# Patient Record
Sex: Female | Born: 1970
Health system: Southern US, Community
[De-identification: ages and names within clinical notes are randomized; demographics above are authoritative.]

## PROBLEM LIST (undated history)

## (undated) DIAGNOSIS — E785 Hyperlipidemia, unspecified: Secondary | ICD-10-CM

## (undated) DIAGNOSIS — D1800 Hemangioma unspecified site: Secondary | ICD-10-CM

## (undated) HISTORY — PX: TONSILLECTOMY: SUR1361

## (undated) HISTORY — PX: TUBAL LIGATION: SHX77

## (undated) HISTORY — PX: ABDOMINAL HYSTERECTOMY: SHX81

## (undated) HISTORY — DX: Hemangioma unspecified site: D18.00

## (undated) HISTORY — DX: Hyperlipidemia, unspecified: E78.5

---

## 2000-11-04 ENCOUNTER — Ambulatory Visit (HOSPITAL_COMMUNITY): Admission: RE | Admit: 2000-11-04 | Discharge: 2000-11-04 | Payer: Self-pay | Admitting: *Deleted

## 2000-11-04 ENCOUNTER — Encounter: Payer: Self-pay | Admitting: *Deleted

## 2001-03-19 ENCOUNTER — Encounter: Payer: Self-pay | Admitting: Internal Medicine

## 2001-03-19 ENCOUNTER — Ambulatory Visit (HOSPITAL_COMMUNITY): Admission: RE | Admit: 2001-03-19 | Discharge: 2001-03-19 | Payer: Self-pay | Admitting: Internal Medicine

## 2001-04-13 ENCOUNTER — Encounter: Payer: Self-pay | Admitting: Gastroenterology

## 2001-04-13 ENCOUNTER — Ambulatory Visit (HOSPITAL_COMMUNITY): Admission: RE | Admit: 2001-04-13 | Discharge: 2001-04-13 | Payer: Self-pay | Admitting: Gastroenterology

## 2002-01-15 ENCOUNTER — Emergency Department (HOSPITAL_COMMUNITY): Admission: EM | Admit: 2002-01-15 | Discharge: 2002-01-15 | Payer: Self-pay | Admitting: Emergency Medicine

## 2002-01-15 ENCOUNTER — Encounter: Payer: Self-pay | Admitting: Emergency Medicine

## 2003-06-27 ENCOUNTER — Ambulatory Visit (HOSPITAL_COMMUNITY): Admission: RE | Admit: 2003-06-27 | Discharge: 2003-06-27 | Payer: Self-pay | Admitting: Otolaryngology

## 2003-08-21 ENCOUNTER — Emergency Department (HOSPITAL_COMMUNITY): Admission: EM | Admit: 2003-08-21 | Discharge: 2003-08-21 | Payer: Self-pay | Admitting: Emergency Medicine

## 2005-08-27 ENCOUNTER — Ambulatory Visit (HOSPITAL_COMMUNITY): Admission: RE | Admit: 2005-08-27 | Discharge: 2005-08-27 | Payer: Self-pay | Admitting: Family Medicine

## 2007-08-08 ENCOUNTER — Encounter (HOSPITAL_COMMUNITY): Admission: RE | Admit: 2007-08-08 | Discharge: 2007-09-07 | Payer: Self-pay | Admitting: Internal Medicine

## 2007-08-08 ENCOUNTER — Ambulatory Visit (HOSPITAL_COMMUNITY): Payer: Self-pay | Admitting: Internal Medicine

## 2007-08-17 ENCOUNTER — Ambulatory Visit (HOSPITAL_COMMUNITY): Admission: RE | Admit: 2007-08-17 | Discharge: 2007-08-17 | Payer: Self-pay | Admitting: Internal Medicine

## 2007-11-03 ENCOUNTER — Ambulatory Visit (HOSPITAL_COMMUNITY): Admission: RE | Admit: 2007-11-03 | Discharge: 2007-11-03 | Payer: Self-pay | Admitting: Family Medicine

## 2007-11-16 ENCOUNTER — Encounter (HOSPITAL_COMMUNITY): Admission: RE | Admit: 2007-11-16 | Discharge: 2007-12-16 | Payer: Self-pay | Admitting: Family Medicine

## 2007-12-22 ENCOUNTER — Ambulatory Visit (HOSPITAL_COMMUNITY): Admission: RE | Admit: 2007-12-22 | Discharge: 2007-12-22 | Payer: Self-pay | Admitting: Family Medicine

## 2009-02-14 ENCOUNTER — Emergency Department (HOSPITAL_COMMUNITY): Admission: EM | Admit: 2009-02-14 | Discharge: 2009-02-14 | Payer: Self-pay | Admitting: Emergency Medicine

## 2010-04-17 IMAGING — NM NM HEPATO W/GB/PHARM/[PERSON_NAME]
2 series · 12 of 12 positions shown · non-contrast
Comparison: none

CLINICAL DATA: Right upper abdominal pain.  Unremarkable
ultrasound.

[Series 1: hepatobiliary · 3.20mm/px · 6 of 60 frames shown (1 of 2)]
[frame 6/60]
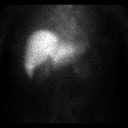
[frame 16/60]
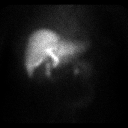
[frame 26/60]
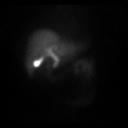
[frame 36/60]
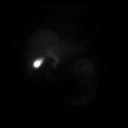
[frame 46/60]
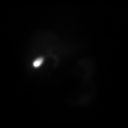
[frame 56/60]
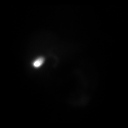

[Series 1: hepatobiliary · 3.20mm/px · 6 of 60 frames shown (2 of 2)]
[frame 6/60]
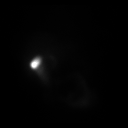
[frame 16/60]
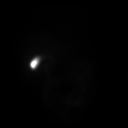
[frame 26/60]
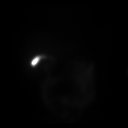
[frame 36/60]
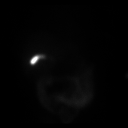
[frame 46/60]
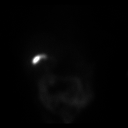
[frame 56/60]
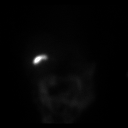

[12 of 12 positions shown; findings below may reference images not displayed]

HEPATOBILIARY SCINTIGRAPHY WITH EJECTION FRACTION

Anterior imaging after fivemCi RcWW9 Choletec IV. There is prompt
clearance of the radiopharmaceutical from the blood pool. Timely
visualization of activity in central bile ducts, small bowel, and
gallbladder.
After 1 hour, patient ingested 8 oz half-and-half p.o. The
calculated gallbladder ejection fraction over 60 minutes is 61%.

IMPRESSION
1. Patency of cystic and common bile ducts.
2. Normal gallbladder ejection fraction.

## 2010-10-15 ENCOUNTER — Other Ambulatory Visit (HOSPITAL_COMMUNITY): Payer: Self-pay | Admitting: Internal Medicine

## 2010-10-15 DIAGNOSIS — R142 Eructation: Secondary | ICD-10-CM

## 2010-10-15 DIAGNOSIS — R141 Gas pain: Secondary | ICD-10-CM

## 2010-10-19 ENCOUNTER — Ambulatory Visit (HOSPITAL_COMMUNITY)
Admission: RE | Admit: 2010-10-19 | Discharge: 2010-10-19 | Disposition: A | Discharge status: Home / Self Care | Payer: BC Managed Care – PPO | Source: Ambulatory Visit | Attending: Internal Medicine | Admitting: Internal Medicine

## 2010-10-19 DIAGNOSIS — R109 Unspecified abdominal pain: Secondary | ICD-10-CM | POA: Insufficient documentation

## 2010-10-19 DIAGNOSIS — R142 Eructation: Secondary | ICD-10-CM

## 2010-10-19 DIAGNOSIS — R141 Gas pain: Secondary | ICD-10-CM

## 2010-10-19 DIAGNOSIS — R932 Abnormal findings on diagnostic imaging of liver and biliary tract: Secondary | ICD-10-CM | POA: Insufficient documentation

## 2010-10-22 ENCOUNTER — Other Ambulatory Visit (HOSPITAL_COMMUNITY): Payer: Self-pay | Admitting: Internal Medicine

## 2010-10-22 DIAGNOSIS — R10811 Right upper quadrant abdominal tenderness: Secondary | ICD-10-CM

## 2010-10-23 ENCOUNTER — Encounter (HOSPITAL_COMMUNITY): Payer: Self-pay

## 2010-10-23 ENCOUNTER — Encounter (HOSPITAL_COMMUNITY)
Admission: RE | Admit: 2010-10-23 | Discharge: 2010-10-23 | Disposition: A | Discharge status: Home / Self Care | Payer: BC Managed Care – PPO | Source: Ambulatory Visit | Attending: Internal Medicine | Admitting: Internal Medicine

## 2010-10-23 DIAGNOSIS — R1011 Right upper quadrant pain: Secondary | ICD-10-CM | POA: Insufficient documentation

## 2010-10-23 DIAGNOSIS — R10811 Right upper quadrant abdominal tenderness: Secondary | ICD-10-CM

## 2010-10-23 MED ORDER — TECHNETIUM TC 99M MEBROFENIN IV KIT
5.0000 | PACK | Freq: Once | INTRAVENOUS | Status: AC | PRN
  Administered 2010-10-23: 5.1 via INTRAVENOUS

## 2010-10-23 NOTE — Op Note (Signed)
NAME:  Briana Clark, Briana Clark                          ACCOUNT NO.:  [id number]   MEDICAL RECORD NO.:  [id number]                   PATIENT TYPE:  EMS   LOCATION:  ED                                   FACILITY:  [facility]   PHYSICIAN:  Gerrit Friends. Rourk, M.D.               DATE OF BIRTH:  27-Nov-1970   DATE OF PROCEDURE:  01/15/2002  DATE OF DISCHARGE:  01/15/2002                                 OPERATIVE REPORT   PROCEDURE:  Emergent esophagogastroduodenoscopy with removal of esophageal  food impaction.   ENDOSCOPIST:  Gerrit Friends. Rourk, M.D.   INDICATIONS FOR PROCEDURE:  The patient is a 40 year old lady with chronic  esophageal dysphagia and gastroesophageal reflux disease for which she takes  Nexium.  She underwent esophageal by Dr. Ritta Slot down in [place] last  November.  She was having esophageal dysphagia.  She is not sure exactly  what was found; however, she has had recurrent esophageal dysphagia.  She  was eating chicken for lunch today and felt it get hung.  She has not been  able to swallow anything, including her saliva, ever since.  Glucagon and  Versed were not helpful in the emergency department, prescribed by Dr. Rosalia Hammers,  unfortunately.  Emergent EGD is now being done to remove probable food  impaction.  This approach has been discussed with the patient at the  bedside.  The potential risks, benefits, and alternatives have been  reviewed; and questions answered.  She is agreeable.  Please see my  handwritten H&P.  She is at low risk for conscious sedation.   DESCRIPTION OF PROCEDURE:  O2 saturation, blood pressure, pulse and  respirations were monitored throughout the entire procedure.   CONSCIOUS SEDATION:  Versed 4 mg IV, Demerol 100 mg IV in divided doses,  Cetacaine was gargled and expectorated.   INSTRUMENT:  Olympus video chip gastroscope.   FINDINGS:  Examination of the tubular esophagus revealed a  large bolus of  food involving the distal esophagus.  The tip of  the scope was approximated  against the bolus and it was pushed into the esophagus across a muscular  appearing Schatzki ring.  There appeared to be a good-sized hiatal hernia.   STOMACH:  There was quite a bit of food in the stomach which precluded  complete examination.   The patient tolerated the procedure well and was reacted in endoscopy.   IMPRESSION:  1. Esophageal food impaction. Status post disimpaction as described above.  2. Muscular appearing Schatzki ring, moderate-to-large size hiatal hernia.     Exam incomplete for reasons outlined above.   RECOMMENDATIONS:  1. Chew food thoroughly have liquids on hand to assist with the swallowing     process.  2. Continue Nexium.  3. Will make arrangements to bring the patient back for an elective EGD with     esophageal dilation in the very near future.   I  would like to thank Dr. Margarita Grizzle for allowing me to see this nice  lady.                                               Gerrit Friends. Rourk, M.D.    RMR/MEDQ  D:  01/15/2002  T:  01/22/2002  Job:  [id number]   cc:   [facility]   Hilario Quarry, M.D.   Emergency Department

## 2014-06-06 ENCOUNTER — Other Ambulatory Visit (HOSPITAL_COMMUNITY): Payer: Self-pay | Admitting: Family Medicine

## 2014-06-06 DIAGNOSIS — R1011 Right upper quadrant pain: Secondary | ICD-10-CM

## 2014-06-13 ENCOUNTER — Encounter (HOSPITAL_COMMUNITY)
Admission: RE | Admit: 2014-06-13 | Discharge: 2014-06-13 | Disposition: A | Discharge status: Home / Self Care | Payer: BLUE CROSS/BLUE SHIELD | Source: Ambulatory Visit | Attending: Family Medicine | Admitting: Family Medicine

## 2014-06-13 ENCOUNTER — Encounter (HOSPITAL_COMMUNITY): Payer: Self-pay

## 2014-06-13 DIAGNOSIS — R1011 Right upper quadrant pain: Secondary | ICD-10-CM | POA: Insufficient documentation

## 2014-06-13 MED ORDER — TECHNETIUM TC 99M MEBROFENIN IV KIT
5.0000 | PACK | Freq: Once | INTRAVENOUS | Status: AC | PRN
Start: 2014-06-13 — End: 2014-06-13
  Administered 2014-06-13: 5 via INTRAVENOUS

## 2014-06-13 MED ORDER — SINCALIDE 5 MCG IJ SOLR
INTRAMUSCULAR | Status: AC
Start: 2014-06-13 — End: 2014-06-13
  Administered 2014-06-13: 1.68 ug via INTRAVENOUS
  Filled 2014-06-13: qty 5

## 2014-06-13 MED ORDER — STERILE WATER FOR INJECTION IJ SOLN
INTRAMUSCULAR | Status: AC
  Administered 2014-06-13: 5 mL via INTRAVENOUS
  Filled 2014-06-13: qty 10

## 2014-06-13 MED ORDER — SODIUM CHLORIDE 0.9 % IJ SOLN
INTRAMUSCULAR | Status: AC
Start: 2014-06-13 — End: 2014-06-13
  Filled 2014-06-13: qty 36

## 2014-10-01 ENCOUNTER — Encounter (INDEPENDENT_AMBULATORY_CARE_PROVIDER_SITE_OTHER): Payer: Self-pay | Admitting: *Deleted

## 2014-10-15 ENCOUNTER — Ambulatory Visit (INDEPENDENT_AMBULATORY_CARE_PROVIDER_SITE_OTHER): Payer: BLUE CROSS/BLUE SHIELD | Admitting: Internal Medicine

## 2015-01-14 ENCOUNTER — Encounter: Payer: Self-pay | Admitting: Internal Medicine

## 2015-02-06 ENCOUNTER — Ambulatory Visit: Payer: BLUE CROSS/BLUE SHIELD | Admitting: Gastroenterology

## 2015-11-23 DIAGNOSIS — M62830 Muscle spasm of back: Secondary | ICD-10-CM | POA: Diagnosis not present

## 2015-11-23 DIAGNOSIS — G5603 Carpal tunnel syndrome, bilateral upper limbs: Secondary | ICD-10-CM | POA: Diagnosis not present

## 2016-02-17 ENCOUNTER — Encounter: Payer: Self-pay | Admitting: Orthopaedic Surgery

## 2016-02-17 ENCOUNTER — Ambulatory Visit (INDEPENDENT_AMBULATORY_CARE_PROVIDER_SITE_OTHER): Payer: BLUE CROSS/BLUE SHIELD | Admitting: Orthopaedic Surgery

## 2016-02-17 VITALS — BP 118/78 | HR 111 | Temp 97.5°F | Ht 63.5 in | Wt 185.0 lb

## 2016-02-17 DIAGNOSIS — G5603 Carpal tunnel syndrome, bilateral upper limbs: Secondary | ICD-10-CM | POA: Diagnosis not present

## 2016-02-17 DIAGNOSIS — J42 Unspecified chronic bronchitis: Secondary | ICD-10-CM

## 2016-02-17 DIAGNOSIS — F17200 Nicotine dependence, unspecified, uncomplicated: Secondary | ICD-10-CM

## 2016-02-17 DIAGNOSIS — Z720 Tobacco use: Secondary | ICD-10-CM

## 2016-02-17 NOTE — Progress Notes (Signed)
Subjective:    Patient ID: Briana Clark, female    DOB: 11-Mar-1971, 45 y.o.   MRN: [id number]  HPI The patient has a many month history of pain and tenderness of both hands with nocturnal numbness, pain after driving the car a long distance with numbness in both hands, dropping things. She has tried Advil, ice, heat and night splints.  The splints helped at first but they do nothing now.  She has pain running up her arm at times to the shoulders.  She is not getting any better.  Her family doctor told her she had carpal tunnel syndrome and needed surgery now.  She has no trauma.  She is a tobacco smoker.  She is willing to quit  She has COPD.  Review of Systems  HENT: Negative for congestion.   Respiratory: Positive for shortness of breath. Negative for cough.   Cardiovascular: Negative for chest pain and leg swelling.  Endocrine: Positive for cold intolerance.  Musculoskeletal: Positive for arthralgias and myalgias.  Allergic/Immunologic: Positive for environmental allergies.   No past medical history on file.  No past surgical history on file.  No current outpatient prescriptions on file prior to visit.   No current facility-administered medications on file prior to visit.     Social History   Social History  . Marital status: Married    Spouse name: N/A  . Number of children: N/A  . Years of education: N/A   Occupational History  . Not on file.   Social History Main Topics  . Smoking status: Current Every Day Smoker  . Smokeless tobacco: Never Used  . Alcohol use Not on file  . Drug use: Unknown  . Sexual activity: Not on file   Other Topics Concern  . Not on file   Social History Narrative  . No narrative on file    Family History  Problem Relation Age of Onset  . Asthma Mother   . COPD Mother   . Cancer Father     BP 118/78   Pulse (!) 111   Temp 97.5 F (36.4 C)   Ht 5' 3.5" (1.613 m)   Wt 185 lb (83.9 kg)   BMI 32.26 kg/m       Objective:   Physical Exam  Constitutional: She is oriented to person, place, and time. She appears well-developed and well-nourished.  HENT:  Head: Normocephalic and atraumatic.  Eyes: Conjunctivae and EOM are normal. Pupils are equal, round, and reactive to light.  Neck: Normal range of motion. Neck supple.  Cardiovascular: Normal rate, regular rhythm and intact distal pulses.   Pulmonary/Chest: Effort normal.  Abdominal: Soft.  Musculoskeletal: She exhibits tenderness (Pain both hands, positive Tinel and Phalan signs both hands, grips good, decreased sensation in median nerve distribution bilaterally.).  Neurological: She is alert and oriented to person, place, and time. She displays normal reflexes. No cranial nerve deficit. She exhibits normal muscle tone. Coordination normal.  Skin: Skin is warm and dry.  She has congenital hemangioma right dorsal hand.   Psychiatric: She has a normal mood and affect. Her behavior is normal. Judgment and thought content normal.          Assessment & Plan:   Encounter Diagnoses  Name Primary?  . Bilateral carpal tunnel syndrome Yes  . Tobacco smoker within last 12 months   . Chronic bronchitis, unspecified chronic bronchitis type (HCC)     I will have her get EMGs of the hands.  Return after  EMG done  Call if any problem  I have discussed possible surgery need with her.  She asked appropriate questions.  Precautions discussed.  Electronically Signed Darreld Mclean, MD 9/12/201711:10 AM

## 2016-02-17 NOTE — Patient Instructions (Signed)
Smoking Cessation, Tips for Success If you are ready to quit smoking, congratulations! You have chosen to help yourself be healthier. Cigarettes bring nicotine, tar, carbon monoxide, and other irritants into your body. Your lungs, heart, and blood vessels will be able to work better without these poisons. There are many different ways to quit smoking. Nicotine gum, nicotine patches, a nicotine inhaler, or nicotine nasal spray can help with physical craving. Hypnosis, support groups, and medicines help break the habit of smoking. WHAT THINGS CAN I DO TO MAKE QUITTING EASIER?  Here are some tips to help you quit for good:  Pick a date when you will quit smoking completely. Tell all of your friends and family about your plan to quit on that date.  Do not try to slowly cut down on the number of cigarettes you are smoking. Pick a quit date and quit smoking completely starting on that day.  Throw away all cigarettes.   Clean and remove all ashtrays from your home, work, and car.  On a card, write down your reasons for quitting. Carry the card with you and read it when you get the urge to smoke.  Cleanse your body of nicotine. Drink enough water and fluids to keep your urine clear or pale yellow. Do this after quitting to flush the nicotine from your body.  Learn to predict your moods. Do not let a bad situation be your excuse to have a cigarette. Some situations in your life might tempt you into wanting a cigarette.  Never have "just one" cigarette. It leads to wanting another and another. Remind yourself of your decision to quit.  Change habits associated with smoking. If you smoked while driving or when feeling stressed, try other activities to replace smoking. Stand up when drinking your coffee. Brush your teeth after eating. Sit in a different chair when you read the paper. Avoid alcohol while trying to quit, and try to drink fewer caffeinated beverages. Alcohol and caffeine may urge you to  smoke.  Avoid foods and drinks that can trigger a desire to smoke, such as sugary or spicy foods and alcohol.  Ask people who smoke not to smoke around you.  Have something planned to do right after eating or having a cup of coffee. For example, plan to take a walk or exercise.  Try a relaxation exercise to calm you down and decrease your stress. Remember, you may be tense and nervous for the first 2 weeks after you quit, but this will pass.  Find new activities to keep your hands busy. Play with a pen, coin, or rubber band. Doodle or draw things on paper.  Brush your teeth right after eating. This will help cut down on the craving for the taste of tobacco after meals. You can also try mouthwash.   Use oral substitutes in place of cigarettes. Try using lemon drops, carrots, cinnamon sticks, or chewing gum. Keep them handy so they are available when you have the urge to smoke.  When you have the urge to smoke, try deep breathing.  Designate your home as a nonsmoking area.  If you are a heavy smoker, ask your health care provider about a prescription for nicotine chewing gum. It can ease your withdrawal from nicotine.  Reward yourself. Set aside the cigarette money you save and buy yourself something nice.  Look for support from others. Join a support group or smoking cessation program. Ask someone at home or at work to help you with your plan   to quit smoking.  Always ask yourself, "Do I need this cigarette or is this just a reflex?" Tell yourself, "Today, I choose not to smoke," or "I do not want to smoke." You are reminding yourself of your decision to quit.  Do not replace cigarette smoking with electronic cigarettes (commonly called e-cigarettes). The safety of e-cigarettes is unknown, and some may contain harmful chemicals.  If you relapse, do not give up! Plan ahead and think about what you will do the next time you get the urge to smoke. HOW WILL I FEEL WHEN I QUIT SMOKING? You  may have symptoms of withdrawal because your body is used to nicotine (the addictive substance in cigarettes). You may crave cigarettes, be irritable, feel very hungry, cough often, get headaches, or have difficulty concentrating. The withdrawal symptoms are only temporary. They are strongest when you first quit but will go away within 10-14 days. When withdrawal symptoms occur, stay in control. Think about your reasons for quitting. Remind yourself that these are signs that your body is healing and getting used to being without cigarettes. Remember that withdrawal symptoms are easier to treat than the major diseases that smoking can cause.  Even after the withdrawal is over, expect periodic urges to smoke. However, these cravings are generally short lived and will go away whether you smoke or not. Do not smoke! WHAT RESOURCES ARE AVAILABLE TO HELP ME QUIT SMOKING? Your health care provider can direct you to community resources or hospitals for support, which may include:  Group support.  Education.  Hypnosis.  Therapy.   This information is not intended to replace advice given to you by your health care provider. Make sure you discuss any questions you have with your health care provider.   Document Released: 02/20/2004 Document Revised: 06/14/2014 Document Reviewed: 11/09/2012 Elsevier Interactive Patient Education 2016 [employer]  

## 2016-02-18 ENCOUNTER — Telehealth: Payer: Self-pay | Admitting: Radiology

## 2016-02-18 NOTE — Telephone Encounter (Signed)
EMG/NCV study was scheduled at [facility] for 02/23/16 @ 10:00am.  I called and left a message for the patient regarding the apt and the need for her to call and schedule a return apt.

## 2016-02-19 ENCOUNTER — Telehealth: Payer: Self-pay | Admitting: Radiology

## 2016-02-19 NOTE — Telephone Encounter (Signed)
Briana Clark spoke with the patient and gave her the information regarding the EMG apt and her return apt.  She stated she is going to call and reschedule the EMG for another day and will let us know.

## 2016-02-23 ENCOUNTER — Encounter: Payer: Self-pay | Admitting: Orthopaedic Surgery

## 2016-02-23 DIAGNOSIS — G5600 Carpal tunnel syndrome, unspecified upper limb: Secondary | ICD-10-CM | POA: Diagnosis not present

## 2016-02-26 ENCOUNTER — Encounter: Payer: Self-pay | Admitting: Orthopaedic Surgery

## 2016-02-26 ENCOUNTER — Ambulatory Visit (INDEPENDENT_AMBULATORY_CARE_PROVIDER_SITE_OTHER): Payer: BLUE CROSS/BLUE SHIELD | Admitting: Orthopaedic Surgery

## 2016-02-26 VITALS — BP 126/81 | HR 99 | Temp 97.3°F | Ht 63.0 in | Wt 184.0 lb

## 2016-02-26 DIAGNOSIS — M25532 Pain in left wrist: Secondary | ICD-10-CM

## 2016-02-26 DIAGNOSIS — M25531 Pain in right wrist: Secondary | ICD-10-CM | POA: Diagnosis not present

## 2016-02-26 NOTE — Progress Notes (Signed)
Patient Briana Clark:[id number] B Zagorski, female DOB:May 31, 1971, 45 y.o. [id number]  Chief Complaint  Patient presents with  . Follow-up    bilateral wrist pain, emg review    HPI  Robbie B Handal is a 45 y.o. female who has had pain in both wrists consistent with carpal tunnel syndrome.  She has night nocturnal pain.  She had EMGs done which showed a normal study, no evidence of a median mononeuropathy present.  Study done on 02-23-16.  I have informed her of the findings.  I have recommended Aleve, samples given. HPI  Body mass index is 32.59 kg/m.  ROS  Review of Systems  HENT: Negative for congestion.   Respiratory: Positive for shortness of breath. Negative for cough.   Cardiovascular: Negative for chest pain and leg swelling.  Endocrine: Positive for cold intolerance.  Musculoskeletal: Positive for arthralgias and myalgias.  Allergic/Immunologic: Positive for environmental allergies.    No past medical history on file.  No past surgical history on file.  Family History  Problem Relation Age of Onset  . Asthma Mother   . COPD Mother   . Cancer Father     Social History Social History  Substance Use Topics  . Smoking status: Current Every Day Smoker  . Smokeless tobacco: Never Used  . Alcohol use Not on file    Allergies  Allergen Reactions  . Latex   . Penicillins     Current Outpatient Prescriptions  Medication Sig Dispense Refill  . Biotin w/ Vitamins C & E (HAIR/SKIN/NAILS PO) Take by mouth.    . OMEPRAZOLE PO Take by mouth.     No current facility-administered medications for this visit.      Physical Exam  Blood pressure 126/81, pulse 99, temperature 97.3 F (36.3 C), height 5\' 3"  (1.6 m), weight 184 lb (83.5 kg).  Constitutional: overall normal hygiene, normal nutrition, well developed, normal grooming, normal body habitus. Assistive device:none  Musculoskeletal: gait and station Limp none, muscle tone and strength are normal, no tremors or atrophy  is present.  .  Neurological: coordination overall normal.  Deep tendon reflex/nerve stretch intact.  Sensation normal.  Cranial nerves II-XII intact.   Skin:   Normal overall no scars, lesions, ulcers or rashes. No psoriasis.  Psychiatric: Alert and oriented x 3.  Recent memory intact, remote memory unclear.  Normal mood and affect. Well groomed.  Good eye contact.  Cardiovascular: overall no swelling, no varicosities, no edema bilaterally, normal temperatures of the legs and arms, no clubbing, cyanosis and good capillary refill.  Lymphatic: palpation is normal.  She has positive Tinel and Phalens sign on both wrists. ROM is full.  NV intact.  The patient has been educated about the nature of the problem(s) and counseled on treatment options.  The patient appeared to understand what I have discussed and is in agreement with it.  Encounter Diagnosis  Name Primary?  . Pain in both wrists Yes    PLAN Call if any problems.  Precautions discussed.  Continue current medications.   Return to clinic PRN   Electronically Signed Sanjuana Kava, MD 9/21/201710:04 AM

## 2016-02-26 NOTE — Patient Instructions (Signed)
Smoking Cessation, Tips for Success If you are ready to quit smoking, congratulations! You have chosen to help yourself be healthier. Cigarettes bring nicotine, tar, carbon monoxide, and other irritants into your body. Your lungs, heart, and blood vessels will be able to work better without these poisons. There are many different ways to quit smoking. Nicotine gum, nicotine patches, a nicotine inhaler, or nicotine nasal spray can help with physical craving. Hypnosis, support groups, and medicines help break the habit of smoking. WHAT THINGS CAN I DO TO MAKE QUITTING EASIER?  Here are some tips to help you quit for good:  Pick a date when you will quit smoking completely. Tell all of your friends and family about your plan to quit on that date.  Do not try to slowly cut down on the number of cigarettes you are smoking. Pick a quit date and quit smoking completely starting on that day.  Throw away all cigarettes.   Clean and remove all ashtrays from your home, work, and car.  On a card, write down your reasons for quitting. Carry the card with you and read it when you get the urge to smoke.  Cleanse your body of nicotine. Drink enough water and fluids to keep your urine clear or pale yellow. Do this after quitting to flush the nicotine from your body.  Learn to predict your moods. Do not let a bad situation be your excuse to have a cigarette. Some situations in your life might tempt you into wanting a cigarette.  Never have "just one" cigarette. It leads to wanting another and another. Remind yourself of your decision to quit.  Change habits associated with smoking. If you smoked while driving or when feeling stressed, try other activities to replace smoking. Stand up when drinking your coffee. Brush your teeth after eating. Sit in a different chair when you read the paper. Avoid alcohol while trying to quit, and try to drink fewer caffeinated beverages. Alcohol and caffeine may urge you to  smoke.  Avoid foods and drinks that can trigger a desire to smoke, such as sugary or spicy foods and alcohol.  Ask people who smoke not to smoke around you.  Have something planned to do right after eating or having a cup of coffee. For example, plan to take a walk or exercise.  Try a relaxation exercise to calm you down and decrease your stress. Remember, you may be tense and nervous for the first 2 weeks after you quit, but this will pass.  Find new activities to keep your hands busy. Play with a pen, coin, or rubber band. Doodle or draw things on paper.  Brush your teeth right after eating. This will help cut down on the craving for the taste of tobacco after meals. You can also try mouthwash.   Use oral substitutes in place of cigarettes. Try using lemon drops, carrots, cinnamon sticks, or chewing gum. Keep them handy so they are available when you have the urge to smoke.  When you have the urge to smoke, try deep breathing.  Designate your home as a nonsmoking area.  If you are a heavy smoker, ask your health care provider about a prescription for nicotine chewing gum. It can ease your withdrawal from nicotine.  Reward yourself. Set aside the cigarette money you save and buy yourself something nice.  Look for support from others. Join a support group or smoking cessation program. Ask someone at home or at work to help you with your plan   to quit smoking.  Always ask yourself, "Do I need this cigarette or is this just a reflex?" Tell yourself, "Today, I choose not to smoke," or "I do not want to smoke." You are reminding yourself of your decision to quit.  Do not replace cigarette smoking with electronic cigarettes (commonly called e-cigarettes). The safety of e-cigarettes is unknown, and some may contain harmful chemicals.  If you relapse, do not give up! Plan ahead and think about what you will do the next time you get the urge to smoke. HOW WILL I FEEL WHEN I QUIT SMOKING? You  may have symptoms of withdrawal because your body is used to nicotine (the addictive substance in cigarettes). You may crave cigarettes, be irritable, feel very hungry, cough often, get headaches, or have difficulty concentrating. The withdrawal symptoms are only temporary. They are strongest when you first quit but will go away within 10-14 days. When withdrawal symptoms occur, stay in control. Think about your reasons for quitting. Remind yourself that these are signs that your body is healing and getting used to being without cigarettes. Remember that withdrawal symptoms are easier to treat than the major diseases that smoking can cause.  Even after the withdrawal is over, expect periodic urges to smoke. However, these cravings are generally short lived and will go away whether you smoke or not. Do not smoke! WHAT RESOURCES ARE AVAILABLE TO HELP ME QUIT SMOKING? Your health care provider can direct you to community resources or hospitals for support, which may include:  Group support.  Education.  Hypnosis.  Therapy.   This information is not intended to replace advice given to you by your health care provider. Make sure you discuss any questions you have with your health care provider.   Document Released: 02/20/2004 Document Revised: 06/14/2014 Document Reviewed: 11/09/2012 Elsevier Interactive Patient Education 2016 [employer]  

## 2016-03-08 DIAGNOSIS — J069 Acute upper respiratory infection, unspecified: Secondary | ICD-10-CM | POA: Diagnosis not present

## 2016-03-08 DIAGNOSIS — J329 Chronic sinusitis, unspecified: Secondary | ICD-10-CM | POA: Diagnosis not present

## 2016-08-23 DIAGNOSIS — J40 Bronchitis, not specified as acute or chronic: Secondary | ICD-10-CM | POA: Diagnosis not present

## 2016-08-23 DIAGNOSIS — R0981 Nasal congestion: Secondary | ICD-10-CM | POA: Diagnosis not present

## 2016-09-06 DIAGNOSIS — Z01419 Encounter for gynecological examination (general) (routine) without abnormal findings: Secondary | ICD-10-CM | POA: Diagnosis not present

## 2016-09-06 DIAGNOSIS — Z13220 Encounter for screening for lipoid disorders: Secondary | ICD-10-CM | POA: Diagnosis not present

## 2016-09-06 DIAGNOSIS — Z1329 Encounter for screening for other suspected endocrine disorder: Secondary | ICD-10-CM | POA: Diagnosis not present

## 2016-09-06 DIAGNOSIS — Z6834 Body mass index (BMI) 34.0-34.9, adult: Secondary | ICD-10-CM | POA: Diagnosis not present

## 2016-09-06 DIAGNOSIS — Z131 Encounter for screening for diabetes mellitus: Secondary | ICD-10-CM | POA: Diagnosis not present

## 2016-09-06 DIAGNOSIS — Z1272 Encounter for screening for malignant neoplasm of vagina: Secondary | ICD-10-CM | POA: Diagnosis not present

## 2016-09-07 DIAGNOSIS — E7800 Pure hypercholesterolemia, unspecified: Secondary | ICD-10-CM | POA: Diagnosis not present

## 2016-09-07 DIAGNOSIS — Z6834 Body mass index (BMI) 34.0-34.9, adult: Secondary | ICD-10-CM | POA: Diagnosis not present

## 2016-11-25 DIAGNOSIS — R399 Unspecified symptoms and signs involving the genitourinary system: Secondary | ICD-10-CM | POA: Diagnosis not present

## 2016-11-25 DIAGNOSIS — N3000 Acute cystitis without hematuria: Secondary | ICD-10-CM | POA: Diagnosis not present

## 2016-12-15 DIAGNOSIS — Z6831 Body mass index (BMI) 31.0-31.9, adult: Secondary | ICD-10-CM | POA: Diagnosis not present

## 2016-12-15 DIAGNOSIS — E785 Hyperlipidemia, unspecified: Secondary | ICD-10-CM | POA: Diagnosis not present

## 2017-03-12 DIAGNOSIS — A499 Bacterial infection, unspecified: Secondary | ICD-10-CM | POA: Diagnosis not present

## 2017-03-12 DIAGNOSIS — N390 Urinary tract infection, site not specified: Secondary | ICD-10-CM | POA: Diagnosis not present

## 2017-03-12 DIAGNOSIS — R300 Dysuria: Secondary | ICD-10-CM | POA: Diagnosis not present

## 2017-06-13 DIAGNOSIS — H9201 Otalgia, right ear: Secondary | ICD-10-CM | POA: Diagnosis not present

## 2017-06-13 DIAGNOSIS — L659 Nonscarring hair loss, unspecified: Secondary | ICD-10-CM | POA: Diagnosis not present

## 2017-06-30 DIAGNOSIS — M5412 Radiculopathy, cervical region: Secondary | ICD-10-CM | POA: Diagnosis not present

## 2017-06-30 DIAGNOSIS — M542 Cervicalgia: Secondary | ICD-10-CM | POA: Diagnosis not present

## 2017-06-30 DIAGNOSIS — Z6831 Body mass index (BMI) 31.0-31.9, adult: Secondary | ICD-10-CM | POA: Diagnosis not present

## 2017-07-11 DIAGNOSIS — B029 Zoster without complications: Secondary | ICD-10-CM | POA: Diagnosis not present

## 2017-07-11 DIAGNOSIS — D225 Melanocytic nevi of trunk: Secondary | ICD-10-CM | POA: Diagnosis not present

## 2017-07-12 ENCOUNTER — Encounter: Payer: Self-pay | Admitting: Family Medicine

## 2017-07-12 ENCOUNTER — Other Ambulatory Visit: Payer: Self-pay

## 2017-07-12 ENCOUNTER — Ambulatory Visit: Payer: BLUE CROSS/BLUE SHIELD | Admitting: Family Medicine

## 2017-07-12 VITALS — BP 118/84 | HR 97 | Temp 97.9°F | Resp 16 | Ht 63.0 in | Wt 168.2 lb

## 2017-07-12 DIAGNOSIS — E785 Hyperlipidemia, unspecified: Secondary | ICD-10-CM | POA: Diagnosis not present

## 2017-07-12 DIAGNOSIS — F419 Anxiety disorder, unspecified: Secondary | ICD-10-CM

## 2017-07-12 DIAGNOSIS — B029 Zoster without complications: Secondary | ICD-10-CM

## 2017-07-12 DIAGNOSIS — Z720 Tobacco use: Secondary | ICD-10-CM

## 2017-07-12 MED ORDER — FLUOXETINE HCL 20 MG PO TABS: 20.0000 mg | ORAL_TABLET | Freq: Every day | ORAL | 2 refills | Status: DC

## 2017-07-12 NOTE — Progress Notes (Signed)
Patient ID: Shella Spearing, female    DOB: Jun 09, 1970, 47 y.o.   MRN: [id number]  Chief Complaint  Patient presents with  . Establish Care  . Fatigue    Allergies Latex and Penicillins  Subjective:   Arena B Kloos is a 47 y.o. female who presents to [facility] today.  HPI Here to establish care. Has been recently seen by dermatology and diagnosed with shingles on her right hand.  She reports that she is currently on Valtrex for shingles.  She reports that approximately 1 week prior to her shingles outbreak that she felt very fatigued.  She reports that she is taking the Valtrex medication but is still felt fatigued.  She does have a family history of Hashimoto's thyroiditis.  She recently had lab testing done by her gynecologist which was negative for Hashimoto's.  Her TSH was within normal limits.  She reports that she is starting to feel a little less fatigued but wanted to come in to be seen and establish care.  Her other concern is that she is a smoker.  She has smoked half a pack per day for the past 26 years.  She has tried to quit in the past and has used Chantix and Wellbutrin.  When she took the Chantix she had suicidal thoughts and so this medication was discontinued.  She was unable to tolerate the Wellbutrin due to constipation and the medication also provoked anxiety feelings.  She reports that she has suffered from anxiety for years.  She feels like the main reason she smokes is to help deal with her anxiety.  She reports that she worries constantly and her mind is always thinking and racing.  She does not feel depressed.  She denies any suicidal or homicidal ideations.  She was on citalopram many years ago for anxiety but had weight gain associated with the medication.  She was on this medication when her children were younger and she had more stress.  She ended up coming off the medication.  She believes that she would benefit from medication to help with anxiety.   She is married and had a good relationship with her husband.  Enjoys her job at [employer] here in [place].  Denies any alcohol or drug use.  Attends church regularly and enjoys reading.  Over the past 8 months has lost over 20 pounds by working on her diet and exercising.  Has been followed for her weight loss and high cholesterol by her gynecologist.  She reports that she has a follow-up with him soon and he will be rechecking her cholesterol.  She reports he also told her that she was on the border of being diabetic.  She reports that she is getting those labs done at his office and does not want those duplicated here.  She does report that she will have the labs sent to our office.  She denies any fevers, chills, nausea, vomiting, diarrhea.  Her weight loss is all been intentional.  She does not have menses on a monthly basis due to her hysterectomy.  Nicotine Dependence  Presents for initial visit. Symptoms include cravings and fatigue. Symptoms are negative for insomnia and sore throat. Preferred tobacco types include cigarettes. Preferred cigarette types include filtered. Preferred strength is light. Preferred cigarettes are non-menthol. Her urge triggers include company of smokers, meal time and stress. Her first smoke is before 6 AM. She smokes < 1/2 a pack of cigarettes per day. She started  smoking when she was 7-41 years old. Past treatments include varenicline and buproprion. The treatment provided no relief. Compliance with prior treatments has been good. Angelli is ready to quit. Nhyira has tried to quit 4 times. There is no history of alcohol abuse and drug use.  Anxiety  Presents for initial visit. Onset was 1 to 5 years ago. The problem has been waxing and waning. Symptoms include excessive worry, malaise, nervous/anxious behavior and restlessness. Patient reports no chest pain, compulsions, confusion, dizziness, dry mouth, feeling of choking, insomnia, muscle tension, nausea,  palpitations, panic, shortness of breath or suicidal ideas. Symptoms occur most days. The severity of symptoms is moderate and interfering with daily activities. Nothing aggravates the symptoms. The quality of sleep is good. Nighttime awakenings: occasional.   Her past medical history is significant for anxiety/panic attacks. There is no history of asthma, CHF, chronic lung disease, depression or suicide attempts. Past treatments include SSRIs. The treatment provided mild relief. Compliance with prior treatments has been good.    Past Medical History:  Diagnosis Date  . Hemangioma    right side/UE and LE  . Hyperlipidemia     Past Surgical History:  Procedure Laterality Date  . ABDOMINAL HYSTERECTOMY     TAH without BSO secondary to fibroids  . TONSILLECTOMY    . TUBAL LIGATION      Family History  Problem Relation Age of Onset  . Asthma Mother   . COPD Mother   . Hashimoto's thyroiditis Mother   . Brain cancer Father   . Thyroid cancer Sister   . Hypothyroidism Sister   . CAD Paternal Grandfather      Social History   Socioeconomic History  . Marital status: Married    Spouse name: None  . Number of children: None  . Years of education: None  . Highest education level: None  Social Needs  . Financial resource strain: None  . Food insecurity - worry: None  . Food insecurity - inability: None  . Transportation needs - medical: None  . Transportation needs - non-medical: None  Occupational History  . None  Tobacco Use  . Smoking status: Current Every Day Smoker    Packs/day: 0.50    Years: 26.00    Pack years: 13.00  . Smokeless tobacco: Never Used  . Tobacco comment: Chantix did not work-SI; welbutrin caused constipation  Substance and Sexual Activity  . Alcohol use: No    Frequency: Never  . Drug use: No  . Sexual activity: Yes    Birth control/protection: Surgical  Other Topics Concern  . None  Social History Narrative   Lives in [place], [place].    Works at [employer].   Married, 2 children.    Dogs, walks them regularly.    Eats all foods.   Wear seatbelt.   Enjoys reading and make wreaths.    Current Outpatient Medications on File Prior to Visit  Medication Sig Dispense Refill  . Biotin w/ Vitamins C & E (HAIR/SKIN/NAILS PO) Take by mouth.    . cyclobenzaprine (FLEXERIL) 10 MG tablet Take 10 mg by mouth.    . lidocaine (LIDODERM) 5 % Place onto the skin.    . valACYclovir (VALTREX) 1000 MG tablet      No current facility-administered medications on file prior to visit.     Review of Systems  Constitutional: Positive for fatigue. Negative for activity change, appetite change, fever and unexpected weight change.  HENT: Negative for sore throat, trouble swallowing  and voice change.   Eyes: Negative for visual disturbance.  Respiratory: Negative for cough, chest tightness and shortness of breath.   Cardiovascular: Negative for chest pain, palpitations and leg swelling.  Gastrointestinal: Negative for abdominal pain, nausea and vomiting.  Genitourinary: Negative for dysuria, frequency and urgency.  Musculoskeletal: Negative for myalgias and neck pain.  Skin: Positive for rash.       Has the rash from shingles.  Is always had right-sided hemangioma since birth.  Neurological: Negative for dizziness, syncope and light-headedness.  Hematological: Negative for adenopathy.  Psychiatric/Behavioral: Negative for behavioral problems, confusion, dysphoric mood, sleep disturbance and suicidal ideas. The patient is nervous/anxious. The patient does not have insomnia and is not hyperactive.      Objective:   BP 118/84 (BP Location: Left Arm, Patient Position: Sitting, Cuff Size: Normal)   Pulse 97   Temp 97.9 F (36.6 C) (Temporal)   Resp 16   Ht 5\' 3"  (1.6 m)   Wt 168 lb 4 oz (76.3 kg)   SpO2 97%   BMI 29.80 kg/m   Physical Exam  Constitutional: She is oriented to person, place, and time. She appears well-developed  and well-nourished. No distress.  HENT:  Head: Normocephalic and atraumatic.  Nose: Nose normal.  Mouth/Throat: No oropharyngeal exudate.  Eyes: Pupils are equal, round, and reactive to light. No scleral icterus.  Neck: Normal range of motion. Neck supple. No thyromegaly present.  Cardiovascular: Normal rate, regular rhythm and normal heart sounds.  Pulmonary/Chest: Effort normal and breath sounds normal. No respiratory distress.  Neurological: She is alert and oriented to person, place, and time. No cranial nerve deficit.  Skin: Skin is warm and dry.  Right-sided upper extremity large hemangioma with red/violaceous color.  Right palm with healing linear rash, dermatomal distribution.  No open vesicles or blisters.  Psychiatric: She has a normal mood and affect. Her behavior is normal. Judgment and thought content normal.  Nursing note and vitals reviewed.    Assessment and Plan  1. Anxiety Uncontrolled, chronic and persistent.  Lifestyle and relaxation techniques discussed with patient.  Continue exercise as directed.  Long discussion with patient today.  I do believe she would benefit from anxiety medications.  In addition, her tobacco use is associated with her anxiety.  Counseled regarding mechanism of action, possible side effect, risks versus benefits of medication.  She voiced understanding Suicide risks evaluated and documented in note if present or in the area below.  Patient does not have/denies the following risks: previous suicide attempts, family history of suicide, access to lethal means, prior history of psychiatric disorder, history of alcohol or substance abuse disorder, recent loss of a loved one, or severe hopelessness. Patient has protective factors of family and community support.  Patient reports that family believes is behaving rationally. Patient displays problem solving skills.   Patient specifically denies suicide ideation. Patient has access/information to  healthcare contacts if situation or mood changes where patient is a risk to self or others or mood becomes unstable.   During the encounter, the patient had good eye contact and firm handshake regarding safety contract and agreement to seek help if mood worsens and not to harm self.   Patient understands the treatment plan and is in agreement. Agrees to keep follow up and call prior or return to clinic if needed.   - FLUoxetine (PROZAC) 20 MG tablet; Take 1 tablet (20 mg total) by mouth daily.  Dispense: 30 tablet; Refill: 2  Patient counseled in detail  regarding the risks of medication. Told to call or return to clinic if develop any worrisome signs or symptoms. Patient voiced understanding.   2. Tobacco abuse The 5 A's Model for treating Tobacco Use and Dependence was used today. I have identified and documented tobacco use status for this patient. I have urged the patient to quit tobacco use. At this time, the patient is willing and ready to attempt to quit. We have discussed medication options to aid in smoking cessation including nicotine replacement therapy (NRT), zyban, and chantix. We have also discussed behavioral modifications, lifestyle changes, and patient support options to aid in tobacco cessation success. Patient was congratulated on their desire to make this positive change for their health. Patient instructed to keep their follow up to ensure success.  Patient will call the office back with her quit date.  3. Herpes zoster without complication Continue Valtrex as directed.  Call with questions concerns, or worrisome symptoms.  4. Hyperlipidemia, unspecified hyperlipidemia type Patient will have her lipid profile which is going to be drawn by her gynecologist sent to our office for review.  Today we did discuss dietary modifications.  She was congratulated on her weight loss.  She will have her labs sent to our office for review and at that time will calculate her ASCVD  risk.  Records requested from gynecologist and previous PCP.  Patient will bring in her immunization records.  Influenza vaccination deferred today secondary to current shingles outbreak. Return in about 4 weeks (around 08/09/2017) for follow up. Aliene Beams, MD 07/12/2017

## 2017-07-12 NOTE — Patient Instructions (Signed)
Steps to Quit Smoking Smoking tobacco can be bad for your health. It can also affect almost every organ in your body. Smoking puts you and people around you at risk for many serious long-lasting (chronic) diseases. Quitting smoking is hard, but it is one of the best things that you can do for your health. It is never too late to quit. What are the benefits of quitting smoking? When you quit smoking, you lower your risk for getting serious diseases and conditions. They can include:  Lung cancer or lung disease.  Heart disease.  Stroke.  Heart attack.  Not being able to have children (infertility).  Weak bones (osteoporosis) and broken bones (fractures).  If you have coughing, wheezing, and shortness of breath, those symptoms may get better when you quit. You may also get sick less often. If you are pregnant, quitting smoking can help to lower your chances of having a baby of low birth weight. What can I do to help me quit smoking? Talk with your doctor about what can help you quit smoking. Some things you can do (strategies) include:  Quitting smoking totally, instead of slowly cutting back how much you smoke over a period of time.  Going to in-person counseling. You are more likely to quit if you go to many counseling sessions.  Using resources and support systems, such as: ? Online chats with a counselor. ? Phone quitlines. ? Printed self-help materials. ? Support groups or group counseling. ? Text messaging programs. ? Mobile phone apps or applications.  Taking medicines. Some of these medicines may have nicotine in them. If you are pregnant or breastfeeding, do not take any medicines to quit smoking unless your doctor says it is okay. Talk with your doctor about counseling or other things that can help you.  Talk with your doctor about using more than one strategy at the same time, such as taking medicines while you are also going to in-person counseling. This can help make  quitting easier. What things can I do to make it easier to quit? Quitting smoking might feel very hard at first, but there is a lot that you can do to make it easier. Take these steps:  Talk to your family and friends. Ask them to support and encourage you.  Call phone quitlines, reach out to support groups, or work with a counselor.  Ask people who smoke to not smoke around you.  Avoid places that make you want (trigger) to smoke, such as: ? Bars. ? Parties. ? Smoke-break areas at work.  Spend time with people who do not smoke.  Lower the stress in your life. Stress can make you want to smoke. Try these things to help your stress: ? Getting regular exercise. ? Deep-breathing exercises. ? Yoga. ? Meditating. ? Doing a body scan. To do this, close your eyes, focus on one area of your body at a time from head to toe, and notice which parts of your body are tense. Try to relax the muscles in those areas.  Download or buy apps on your mobile phone or tablet that can help you stick to your quit plan. There are many free apps, such as QuitGuide from the CDC (Centers for Disease Control and Prevention). You can find more support from smokefree.gov and other websites.  This information is not intended to replace advice given to you by your health care provider. Make sure you discuss any questions you have with your health care provider. Document Released: 03/20/2009 Document   Revised: 01/20/2016 Document Reviewed: 10/08/2014 Elsevier Interactive Patient Education  2018 Elsevier Inc.  

## 2017-08-11 ENCOUNTER — Ambulatory Visit: Payer: BLUE CROSS/BLUE SHIELD | Admitting: Family Medicine

## 2017-09-09 ENCOUNTER — Ambulatory Visit: Payer: BLUE CROSS/BLUE SHIELD | Admitting: Family Medicine

## 2017-09-15 ENCOUNTER — Other Ambulatory Visit: Payer: Self-pay

## 2017-09-15 ENCOUNTER — Ambulatory Visit: Payer: BLUE CROSS/BLUE SHIELD | Admitting: Family Medicine

## 2017-09-15 ENCOUNTER — Encounter: Payer: Self-pay | Admitting: Family Medicine

## 2017-09-15 VITALS — BP 122/84 | HR 81 | Temp 97.4°F | Resp 16 | Ht 63.0 in | Wt 167.2 lb

## 2017-09-15 DIAGNOSIS — Z23 Encounter for immunization: Secondary | ICD-10-CM | POA: Diagnosis not present

## 2017-09-15 DIAGNOSIS — F419 Anxiety disorder, unspecified: Secondary | ICD-10-CM | POA: Diagnosis not present

## 2017-09-15 DIAGNOSIS — Z720 Tobacco use: Secondary | ICD-10-CM

## 2017-09-15 MED ORDER — FLUOXETINE HCL 20 MG PO TABS: 20.0000 mg | ORAL_TABLET | Freq: Every day | ORAL | 1 refills | Status: DC

## 2017-09-15 NOTE — Progress Notes (Signed)
Patient ID: Briana Clark, female    DOB: 24-Sep-1970, 47 y.o.   MRN: [id number]  Chief Complaint  Patient presents with  . Follow-up    Allergies Latex and Penicillins  Subjective:   Briana Clark is a 47 y.o. female who presents to [facility] today.  HPI Briana Clark presents for follow-up visit today.  She reports that her anxiety is improved on the Prozac.  She reports that she feels less stressed, anxious, and irritable.  Denies any side effects on the medication.  Does not feel down, depressed, or hopeless.  Energy level is good.  Is very busy with work and her daughter soccer season.  Reports she is trying to eat healthy and get more exercise.  Reports she is sleeping well.  Denies any suicidal or homicidal ideations.  Patient reports she is still smoking cigarettes but she has cut down some on her smoking.  She believes that she is been able to cut down because her anxiety has been decreased.  Anxiety  Presents for follow-up visit. Patient reports no decreased concentration, depressed mood, excessive worry, insomnia, nausea, nervous/anxious behavior, obsessions, panic, restlessness or suicidal ideas. Symptoms occur rarely. The severity of symptoms is mild. The quality of sleep is good. Nighttime awakenings: occasional.   Compliance with medications is 76-100%.    Past Medical History:  Diagnosis Date  . Hemangioma    right side/UE and LE  . Hyperlipidemia     Past Surgical History:  Procedure Laterality Date  . ABDOMINAL HYSTERECTOMY     TAH without BSO secondary to fibroids  . TONSILLECTOMY    . TUBAL LIGATION      Family History  Problem Relation Age of Onset  . Asthma Mother   . COPD Mother   . Hashimoto's thyroiditis Mother   . Brain cancer Father   . Thyroid cancer Sister   . Hypothyroidism Sister   . CAD Paternal Grandfather      Social History   Socioeconomic History  . Marital status: Married    Spouse name: Not on file  . Number of  children: Not on file  . Years of education: Not on file  . Highest education level: Not on file  Occupational History  . Not on file  Social Needs  . Financial resource strain: Not on file  . Food insecurity:    Worry: Not on file    Inability: Not on file  . Transportation needs:    Medical: Not on file    Non-medical: Not on file  Tobacco Use  . Smoking status: Current Every Day Smoker    Packs/day: 0.50    Years: 26.00    Pack years: 13.00  . Smokeless tobacco: Never Used  . Tobacco comment: Chantix did not work-SI; welbutrin caused constipation  Substance and Sexual Activity  . Alcohol use: No    Frequency: Never  . Drug use: No  . Sexual activity: Yes    Birth control/protection: Surgical  Lifestyle  . Physical activity:    Days per week: Not on file    Minutes per session: Not on file  . Stress: Not on file  Relationships  . Social connections:    Talks on phone: Not on file    Gets together: Not on file    Attends religious service: Not on file    Active member of club or organization: Not on file    Attends meetings of clubs or organizations: Not on file  Relationship status: Not on file  Other Topics Concern  . Not on file  Social History Narrative   Lives in [place], [place].   Works at [employer].   Married, 2 children.    Dogs, walks them regularly.    Eats all foods.   Wear seatbelt.   Enjoys reading and make wreaths.    Current Outpatient Medications on File Prior to Visit  Medication Sig Dispense Refill  . Biotin w/ Vitamins C & E (HAIR/SKIN/NAILS PO) Take by mouth.    Marland Kitchen FLUoxetine (PROZAC) 20 MG tablet Take 1 tablet (20 mg total) by mouth daily. 30 tablet 2   No current facility-administered medications on file prior to visit.     Review of Systems  Constitutional: Negative for appetite change, fatigue and unexpected weight change.  Gastrointestinal: Negative for abdominal pain, nausea and vomiting.  Psychiatric/Behavioral:  Negative for agitation, decreased concentration, dysphoric mood, self-injury, sleep disturbance and suicidal ideas. The patient is not nervous/anxious and does not have insomnia.      Objective:   BP 122/84 (BP Location: Left Arm, Patient Position: Sitting, Cuff Size: Normal)   Pulse 81   Temp (!) 97.4 F (36.3 C) (Temporal)   Resp 16   Ht 5\' 3"  (1.6 m)   Wt 167 lb 4 oz (75.9 kg)   SpO2 99%   BMI 29.63 kg/m   Physical Exam  Constitutional: She appears well-developed and well-nourished.  Eyes: Pupils are equal, round, and reactive to light. EOM are normal.  Neck: Normal range of motion. Neck supple.  Cardiovascular: Normal rate, regular rhythm and normal heart sounds.  Pulmonary/Chest: Effort normal and breath sounds normal.  Psychiatric: She has a normal mood and affect. Her speech is normal and behavior is normal. Judgment and thought content normal. Cognition and memory are normal. She expresses no homicidal and no suicidal ideation. She expresses no suicidal plans and no homicidal plans.  Vitals reviewed.    Assessment and Plan  1. Immunization due Vaccinations given today. - Tdap vaccine greater than or equal to 7yo IM  2. Anxiety Improved on medication. Patient counseled in detail regarding the risks of medication. Told to call or return to clinic if develop any worrisome signs or symptoms. Patient voiced understanding.  Patient agrees to follow-up in 3 months.  At that time we will check her cholesterol and other lab work.  She is going to start her exercise and diet program that she has been successful with in the past.  She understands that if she has any problems with her mood or any abrupt changes within her mood to please call or return to clinic.  She denies any suicidal ideations at this time.  She was told to call with any questions or concerns.  She will return to clinic fasting at her next visit to check her cholesterol and other lab work. - FLUoxetine (PROZAC) 20  MG tablet; Take 1 tablet (20 mg total) by mouth daily.  Dispense: 90 tablet; Refill: 1  The patient is asked to make an attempt to improve diet and exercise patterns to aid in medical management of this problem.  The 5 A's Model for treating Tobacco Use and Dependence was used today. I have identified and documented tobacco use status for this patient. I have urged the patient to quit tobacco use. At this time, the patient is not ready to attempt to quit. I have provided patient with information regarding risks, cessation techniques, and interventions that might increase  future attempts to quit smoking. I will plan on again addressing tobacco dependence at the next visit.  No follow-ups on file. Mack Hook, LPN [id number]

## 2017-11-11 ENCOUNTER — Encounter: Payer: Self-pay | Admitting: Family Medicine

## 2017-12-16 ENCOUNTER — Ambulatory Visit: Payer: BLUE CROSS/BLUE SHIELD | Admitting: Family Medicine

## 2017-12-16 ENCOUNTER — Encounter: Payer: Self-pay | Admitting: Family Medicine

## 2017-12-16 ENCOUNTER — Other Ambulatory Visit: Payer: Self-pay

## 2017-12-16 VITALS — BP 110/70 | HR 99 | Temp 97.9°F | Resp 12 | Ht 62.0 in | Wt 169.0 lb

## 2017-12-16 DIAGNOSIS — E782 Mixed hyperlipidemia: Secondary | ICD-10-CM

## 2017-12-16 DIAGNOSIS — F419 Anxiety disorder, unspecified: Secondary | ICD-10-CM | POA: Diagnosis not present

## 2017-12-16 MED ORDER — FLUOXETINE HCL 40 MG PO CAPS: 40.0000 mg | ORAL_CAPSULE | Freq: Every day | ORAL | 0 refills | Status: DC

## 2017-12-16 NOTE — Progress Notes (Signed)
Patient ID: Briana Clark, female    DOB: [birth year], 47 y.o.   MRN: [id number]  Chief Complaint  Patient presents with  . Anxiety    follow up on medication    Allergies Latex and Penicillins  Subjective:   Briana Clark is a 47 y.o. female who presents to [facility] today.  HPI Here for follow up. Has been doing well. Reports that is doing well but has been feeling sad because her daughter is getting ready to leave for college as a Printmaker. Reports that this will be positive b/c will have time for self. Has been walking daily. Husband has been walking with her too. Reports that has gained a few pounds but has been eating a bit more b/c of graduation parties. Reports that the prozac is helping with mood but still feels "lost". Reports that feels a bit sad and down. Gets up and goes to work but has had lower energy and feels down about daughter leaving the house. Feels like could cry. Does not want to check cholesterol today. Would like to get labs prior to next OV. Energy is good. Walking most days of the week with husband. No SI/HI. Feels like the medication helps her not to worry. Would like increase dose.    Past Medical History:  Diagnosis Date  . Hemangioma    right side/UE and LE  . Hyperlipidemia     Past Surgical History:  Procedure Laterality Date  . ABDOMINAL HYSTERECTOMY     TAH without BSO secondary to fibroids  . TONSILLECTOMY    . TUBAL LIGATION      Family History  Problem Relation Age of Onset  . Asthma Mother   . COPD Mother   . Hashimoto's thyroiditis Mother   . Brain cancer Father   . Thyroid cancer Sister   . Hypothyroidism Sister   . CAD Paternal Grandfather      Social History   Socioeconomic History  . Marital status: Married    Spouse name: Not on file  . Number of children: Not on file  . Years of education: Not on file  . Highest education level: Not on file  Occupational History  . Not on file  Social Needs  .  Financial resource strain: Not on file  . Food insecurity:    Worry: Not on file    Inability: Not on file  . Transportation needs:    Medical: Not on file    Non-medical: Not on file  Tobacco Use  . Smoking status: Current Every Day Smoker    Packs/day: 0.50    Years: 26.00    Pack years: 13.00  . Smokeless tobacco: Never Used  . Tobacco comment: Chantix did not work-SI; welbutrin caused constipation  Substance and Sexual Activity  . Alcohol use: No    Frequency: Never  . Drug use: No  . Sexual activity: Yes    Birth control/protection: Surgical  Lifestyle  . Physical activity:    Days per week: Not on file    Minutes per session: Not on file  . Stress: Not on file  Relationships  . Social connections:    Talks on phone: Not on file    Gets together: Not on file    Attends religious service: Not on file    Active member of club or organization: Not on file    Attends meetings of clubs or organizations: Not on file    Relationship status: Not on  file  Other Topics Concern  . Not on file  Social History Narrative   Lives in [place], [place].   Works at [employer].   Married, 2 children.    Dogs, walks them regularly.    Eats all foods.   Wear seatbelt.   Enjoys reading and make wreaths.    Current Outpatient Medications on File Prior to Visit  Medication Sig Dispense Refill  . Biotin w/ Vitamins C & E (HAIR/SKIN/NAILS PO) Take 1 Dose by mouth daily.     Marland Kitchen FLUoxetine (PROZAC) 20 MG tablet Take 1 tablet (20 mg total) by mouth daily. 90 tablet 1   No current facility-administered medications on file prior to visit.     Review of Systems  Constitutional: Negative for activity change, appetite change and fever.  Eyes: Negative for visual disturbance.  Respiratory: Negative for cough, chest tightness and shortness of breath.   Cardiovascular: Negative for chest pain, palpitations and leg swelling.  Gastrointestinal: Negative for abdominal pain, nausea and  vomiting.  Endocrine: Negative for polyphagia and polyuria.  Genitourinary: Negative for dysuria, frequency and urgency.  Musculoskeletal: Negative for back pain.  Skin: Negative for rash.  Neurological: Negative for dizziness, syncope and light-headedness.  Hematological: Negative for adenopathy.  Psychiatric/Behavioral: Negative for agitation, behavioral problems, confusion, decreased concentration, sleep disturbance and suicidal ideas. The patient is nervous/anxious.        Feels down and melancholy at times. Has been more tearful.      Objective:   BP 110/70 (BP Location: Left Arm, Patient Position: Sitting, Cuff Size: Large)   Pulse 99   Temp 97.9 F (36.6 C) (Temporal)   Resp 12   Ht 5\' 2"  (1.575 m)   Wt 169 lb 0.6 oz (76.7 kg)   SpO2 96%   BMI 30.92 kg/m   Physical Exam  Constitutional: She appears well-developed and well-nourished.  HENT:  Head: Normocephalic and atraumatic.  Neck: Normal range of motion. Neck supple.  Cardiovascular: Normal rate, regular rhythm and normal heart sounds.  Pulmonary/Chest: Effort normal and breath sounds normal.  Skin: Skin is warm and dry. Capillary refill takes less than 2 seconds.  Psychiatric: She has a normal mood and affect. Her speech is normal and behavior is normal. Judgment and thought content normal. She is not agitated, not slowed, not withdrawn and not actively hallucinating. Cognition and memory are normal. She expresses no homicidal and no suicidal ideation. She expresses no suicidal plans and no homicidal plans. She is attentive.  Vitals reviewed.  Depression screen [facility] 2/9 12/16/2017 09/15/2017 07/12/2017  Decreased Interest 0 0 0  Down, Depressed, Hopeless 0 0 0  PHQ - 2 Score 0 0 0    Assessment and Plan  1. Anxiety Increase Prozac to 40 mg p.o. daily.  DC 20 mg dosing.  Call with any questions, concerns, or changes in mood.  Counseled regarding risk versus benefits of increased dose and possible side effects. Suicide  risks evaluated and documented in note if present or in the area below.  Patient has protective factors of family and community support.  Patient reports that family believes is behaving rationally. Patient displays problem solving skills.   Patient specifically denies suicide ideation. Patient has access/information to healthcare contacts if situation or mood changes where patient is a risk to self or others or mood becomes unstable.   During the encounter, the patient had good eye contact and firm handshake regarding safety contract and agreement to seek help if mood worsens  and not to harm self.   Patient understands the treatment plan and is in agreement. Agrees to keep follow up and call prior or return to clinic if needed.   - FLUoxetine (PROZAC) 40 MG capsule; Take 1 capsule (40 mg total) by mouth daily.  Dispense: 90 capsule; Refill: 0 She will return to clinic for labs prior to her next visit.  She defers cholesterol lab testing today.  She will like to just get the cholesterol performed.  She gets her other blood work performed by her GYN/Dr. Mora Appl. No follow-ups on file. Edwena Bunde, CMA 12/16/2017

## 2018-02-09 DIAGNOSIS — E782 Mixed hyperlipidemia: Secondary | ICD-10-CM | POA: Diagnosis not present

## 2018-02-09 LAB — LIPID PANEL
CHOLESTEROL: 224 mg/dL — AB (ref <200)
HDL: 38 mg/dL — AB (ref >50)
LDL Cholesterol (Calc): 149 mg/dL (calc) — ABNORMAL HIGH
Non-HDL Cholesterol (Calc): 186 mg/dL (calc) — ABNORMAL HIGH (ref <130)
TRIGLYCERIDES: 229 mg/dL — AB (ref <150)
Total CHOL/HDL Ratio: 5.9 (calc) — ABNORMAL HIGH (ref <5.0)

## 2018-02-14 ENCOUNTER — Encounter: Payer: Self-pay | Admitting: Family Medicine

## 2018-02-16 ENCOUNTER — Ambulatory Visit: Payer: BLUE CROSS/BLUE SHIELD | Admitting: Family Medicine

## 2018-02-16 ENCOUNTER — Encounter: Payer: Self-pay | Admitting: Family Medicine

## 2018-02-16 VITALS — BP 108/70 | HR 90 | Temp 97.6°F | Resp 12 | Ht 62.5 in | Wt 167.1 lb

## 2018-02-16 DIAGNOSIS — E782 Mixed hyperlipidemia: Secondary | ICD-10-CM

## 2018-02-16 DIAGNOSIS — Z716 Tobacco abuse counseling: Secondary | ICD-10-CM | POA: Diagnosis not present

## 2018-02-16 DIAGNOSIS — F419 Anxiety disorder, unspecified: Secondary | ICD-10-CM

## 2018-02-16 DIAGNOSIS — Z23 Encounter for immunization: Secondary | ICD-10-CM

## 2018-02-16 MED ORDER — PRAVASTATIN SODIUM 20 MG PO TABS: 20.0000 mg | ORAL_TABLET | Freq: Every day | ORAL | 0 refills | Status: DC

## 2018-02-16 MED ORDER — FLUOXETINE HCL 40 MG PO CAPS: 40.0000 mg | ORAL_CAPSULE | Freq: Every day | ORAL | 0 refills | Status: AC

## 2018-02-16 NOTE — Patient Instructions (Addendum)
Vitamin D 2000 IU a day Calcium 600 mg twice a day  Pravastatin 20 mg, take at bedtime.   [facility]

## 2018-02-16 NOTE — Progress Notes (Signed)
Patient ID: Briana Clark, female    DOB: 17-Nov-1970, 47 y.o.   MRN: [id number]  Chief Complaint  Patient presents with  . lab results    follow up and discuss  . Hyperlipidemia  . Anxiety    Allergies Latex and Penicillins  Subjective:   Briana Clark is a 47 y.o. female who presents to [facility] today.  HPI Briana Clark presents today for follow-up of her cholesterol.  She has been working over the past several months to improve her diet and exercise patterns.  She reports she has been walking.  She is cut down on much of her fatty foods.  She is switched to chicken and vegetables versus red meat.  She is not been cooking with oil.  She reports that she is still drinking sodas.  She is still smoking approximately 1/2 pack of cigarettes a day.  She was placed on cholesterol medication several years ago due to her high cholesterol but reports that she did develop some muscle aches with it.  She is unsure of the medication name seen.  She is concerned about her cholesterol due to her family history.  She also needs a refill on her Prozac.  She reports the medication is working well.  Her daughter recently left for college after graduating high school.  She reports this is a new phase in her life.  She feels she is dealing with this well.  She does not feel down, depressed, or hopeless.  She denies any suicidal or homicidal ideation.  She reports that she is actually dealing with the change very well.  She is enjoying time with her husband.  She denies any side effects with the medication.  She would like to get a refill today.   Past Medical History:  Diagnosis Date  . Hemangioma    right side/UE and LE  . Hyperlipidemia     Past Surgical History:  Procedure Laterality Date  . ABDOMINAL HYSTERECTOMY     TAH without BSO secondary to fibroids  . TONSILLECTOMY    . TUBAL LIGATION      Family History  Problem Relation Age of Onset  . Asthma Mother   . COPD Mother     . Hashimoto's thyroiditis Mother   . Brain cancer Father   . Thyroid cancer Sister   . Hypothyroidism Sister   . CAD Paternal Grandfather      Social History   Socioeconomic History  . Marital status: Married    Spouse name: Not on file  . Number of children: Not on file  . Years of education: Not on file  . Highest education level: Not on file  Occupational History  . Not on file  Social Needs  . Financial resource strain: Not on file  . Food insecurity:    Worry: Not on file    Inability: Not on file  . Transportation needs:    Medical: Not on file    Non-medical: Not on file  Tobacco Use  . Smoking status: Current Every Day Smoker    Packs/day: 0.50    Years: 26.00    Pack years: 13.00  . Smokeless tobacco: Never Used  . Tobacco comment: Chantix did not work-SI; welbutrin caused constipation  Substance and Sexual Activity  . Alcohol use: No    Frequency: Never  . Drug use: No  . Sexual activity: Yes    Birth control/protection: Surgical  Lifestyle  . Physical activity:  Days per week: 5 days    Minutes per session: 30 min  . Stress: Only a little  Relationships  . Social connections:    Talks on phone: Not on file    Gets together: Not on file    Attends religious service: Not on file    Active member of club or organization: Not on file    Attends meetings of clubs or organizations: Not on file    Relationship status: Not on file  Other Topics Concern  . Not on file  Social History Narrative   Lives in [place], [place].   Works at [employer].   Married, 2 children.    Dogs, walks them regularly.    Eats all foods.   Wear seatbelt.   Enjoys reading and make wreaths.    Current Outpatient Medications on File Prior to Visit  Medication Sig Dispense Refill  . OVER THE COUNTER MEDICATION Take 1 Dose by mouth 2 (two) times daily. Sugar Bear Hair     No current facility-administered medications on file prior to visit.     Review of  Systems   Objective:   BP 108/70 (BP Location: Left Arm, Patient Position: Sitting, Cuff Size: Normal)   Pulse 90   Temp 97.6 F (36.4 C) (Temporal)   Resp 12   Ht 5' 2.5" (1.588 m)   Wt 167 lb 1.3 oz (75.8 kg)   SpO2 96% Comment: room air  BMI 30.07 kg/m   Physical Exam  Constitutional: She is oriented to person, place, and time. She appears well-developed and well-nourished. No distress.  HENT:  Head: Normocephalic and atraumatic.  Mouth/Throat: Oropharynx is clear and moist.  Eyes: Pupils are equal, round, and reactive to light. Conjunctivae and EOM are normal.  Cardiovascular: Normal rate, regular rhythm and normal heart sounds.  Pulmonary/Chest: Effort normal and breath sounds normal. No respiratory distress.  Neurological: She is alert and oriented to person, place, and time. No cranial nerve deficit.  Skin: Skin is warm and dry.  Psychiatric: She has a normal mood and affect. Her behavior is normal. Judgment and thought content normal.  Nursing note and vitals reviewed.  Depression screen [facility] 2/9 02/16/2018 12/16/2017 09/15/2017 07/12/2017  Decreased Interest 0 0 0 0  Down, Depressed, Hopeless 0 0 0 0  PHQ - 2 Score 0 0 0 0    Assessment and Plan  1. Mixed hyperlipidemia Cholesterol panel reviewed with patient in detail today.  We discussed her low HDL, elevated triglycerides, and elevated LDL.  Her calculated 10-year ASCVD risk score is approximately 5%.  We discussed her cardiac risk factors including her tobacco use and family history.  We discussed diet, exercise, and lifestyle modifications to aid in decreasing her cholesterol and improving her modifiable risk factors.  She has previously been on a statin in the past and did have some myalgias.  She is unsure of the name of the medication and the dose.  She would like to try pravastatin to lower her cholesterol.  She feels that she is doing all she can with her diet.  She is also going to try to work on tobacco  cessation.  Hyperlipidemia and the associated risk of ASCVD were discussed today. Primary vs. Secondary prevention of ASCVD were discussed and how it relates to patient morbidity, mortality, and quality of life. Shared decision making with patient including the risks of statins vs.benefits of ASCVD risk reduction discussed.  Risks of stains discussed including myopathy, rhabdomyoloysis, liver problems,  increased risk of diabetes discussed. We discussed heart healthy diet, lifestyle modifications, risk factor modifications, and adherence to the recommended treatment plan. We discussed the need to periodically monitor lipid panel and liver function tests while on statin therapy.   We will start pravastatin 20 mg p.o. nightly.  She will will follow-up with new PCP office within the next 2 to 3 months to have her cholesterol and liver functions checked. - pravastatin (PRAVACHOL) 20 MG tablet; Take 1 tablet (20 mg total) by mouth daily.  Dispense: 90 tablet; Refill: 0  2. Need for immunization against influenza Vaccination given. - Flu Vaccine QUAD 36+ mos IM  3. Anxiety Stable.  Refill medication. Suicide risks evaluated and documented in note if present or in the area below. Patient has protective factors of family and community support.  Patient reports that family believes is behaving rationally. Patient displays problem solving skills.   Patient specifically denies suicide ideation. Patient has access/information to healthcare contacts if situation or mood changes where patient is a risk to self or others or mood becomes unstable.   During the encounter, the patient had good eye contact and firm handshake regarding safety contract and agreement to seek help if mood worsens and not to harm self.   Patient understands the treatment plan and is in agreement. Agrees to keep follow up and call prior or return to clinic if needed.   - FLUoxetine (PROZAC) 40 MG capsule; Take 1 capsule (40 mg total)  by mouth daily.  Dispense: 90 capsule; Refill: 0  The 5 A's Model for treating Tobacco Use and Dependence was used today. I have identified and documented tobacco use status for this patient. I have urged the patient to quit tobacco use. At this time, the patient is going to try and cut down on her tobacco use but is not ready to attempt to quit. I have provided patient with information regarding risks, cessation techniques, and interventions that might increase future attempts to quit smoking. I will plan on again addressing tobacco dependence at the next visit. Follow-up with new PCP office in 2 to 3 months.  Call with questions or concerns. Office visit was 25 minutes.  Greater than 50% of office visit was spent counseling on cholesterol medication, risk, benefits, mechanism of action, possible side effects, need for laboratory checks.  We also discussed lifestyle modifications to aid in lowering her cholesterol.  We also discussed tobacco cessation. No follow-ups on file. Aliene Beams, MD 02/16/2018

## 2018-07-05 DIAGNOSIS — Z6829 Body mass index (BMI) 29.0-29.9, adult: Secondary | ICD-10-CM | POA: Diagnosis not present

## 2018-07-05 DIAGNOSIS — J0140 Acute pansinusitis, unspecified: Secondary | ICD-10-CM | POA: Diagnosis not present

## 2018-12-11 DIAGNOSIS — Z716 Tobacco abuse counseling: Secondary | ICD-10-CM | POA: Diagnosis not present

## 2018-12-11 DIAGNOSIS — Z6831 Body mass index (BMI) 31.0-31.9, adult: Secondary | ICD-10-CM | POA: Diagnosis not present

## 2018-12-11 DIAGNOSIS — Z01419 Encounter for gynecological examination (general) (routine) without abnormal findings: Secondary | ICD-10-CM | POA: Diagnosis not present

## 2018-12-11 DIAGNOSIS — E782 Mixed hyperlipidemia: Secondary | ICD-10-CM | POA: Diagnosis not present

## 2019-02-20 DIAGNOSIS — M722 Plantar fascial fibromatosis: Secondary | ICD-10-CM | POA: Diagnosis not present

## 2019-02-20 DIAGNOSIS — M79671 Pain in right foot: Secondary | ICD-10-CM | POA: Diagnosis not present

## 2019-02-20 DIAGNOSIS — B351 Tinea unguium: Secondary | ICD-10-CM | POA: Diagnosis not present

## 2019-02-20 DIAGNOSIS — M79675 Pain in left toe(s): Secondary | ICD-10-CM | POA: Diagnosis not present

## 2019-05-21 ENCOUNTER — Ambulatory Visit
Admission: EM | Admit: 2019-05-21 | Discharge: 2019-05-21 | Disposition: A | Discharge status: Home / Self Care | Payer: BC Managed Care – PPO | Attending: Emergency Medicine | Admitting: Emergency Medicine

## 2019-05-21 ENCOUNTER — Other Ambulatory Visit: Payer: Self-pay

## 2019-05-21 DIAGNOSIS — Z20828 Contact with and (suspected) exposure to other viral communicable diseases: Secondary | ICD-10-CM | POA: Diagnosis not present

## 2019-05-21 DIAGNOSIS — J0100 Acute maxillary sinusitis, unspecified: Secondary | ICD-10-CM | POA: Diagnosis not present

## 2019-05-21 DIAGNOSIS — Z20822 Contact with and (suspected) exposure to covid-19: Secondary | ICD-10-CM

## 2019-05-21 MED ORDER — FLUTICASONE PROPIONATE 50 MCG/ACT NA SUSP: 1.0000 | Freq: Every day | NASAL | 0 refills | Status: DC

## 2019-05-21 MED ORDER — DOXYCYCLINE HYCLATE 100 MG PO CAPS: 100.0000 mg | ORAL_CAPSULE | Freq: Two times a day (BID) | ORAL | 0 refills | Status: AC

## 2019-05-21 NOTE — ED Triage Notes (Signed)
Pt presents with complaints of sinus congestion and right ear pain x 2 days. Pt denies any fever, cough or known covid contact. Requesting covid testing with todays visit.

## 2019-05-21 NOTE — Discharge Instructions (Addendum)
COVID test result will be available in 2-7 days  Advised patient to take medication as prescribed Take and complete the antibiotic order Drink plenty of water Use Flonase as prescribed To return if symptoms get worse

## 2019-05-21 NOTE — ED Provider Notes (Signed)
[facility]    CSN: [id number] Arrival date & time: 05/21/19  1137      History   Chief Complaint Chief Complaint  Patient presents with  . Facial Pain  . Otalgia    HPI Briana Clark is a 48 y.o. female.   Briana Clark 48 years old female presents to urgent care with maxillary sinus pain and pressure, bilateral ear pain x 3weeks.  She has been using OTC NSAID without relief. She stated her symptom has been getting worse.  Denies any known exposure to Covid flu strep, C1 fever, nausea, vomiting, diarrhea and headaches.  The history is provided by the patient. No language interpreter was used.  Otalgia   Past Medical History:  Diagnosis Date  . Hemangioma    right side/UE and LE  . Hyperlipidemia     Patient Active Problem List   Diagnosis Date Noted  . Anxiety 02/16/2018  . Tobacco abuse counseling 02/16/2018  . Mixed hyperlipidemia 02/16/2018    Past Surgical History:  Procedure Laterality Date  . ABDOMINAL HYSTERECTOMY     TAH without BSO secondary to fibroids  . TONSILLECTOMY    . TUBAL LIGATION      OB History   No obstetric history on file.      Home Medications    Prior to Admission medications   Medication Sig Start Date End Date Taking? Authorizing Provider  FLUoxetine (PROZAC) 40 MG capsule Take 1 capsule (40 mg total) by mouth daily. 02/16/18  Yes Caren Macadam, MD  doxycycline (VIBRAMYCIN) 100 MG capsule Take 1 capsule (100 mg total) by mouth 2 (two) times daily for 7 days. 05/21/19 05/28/19  Ryah Cribb, Darrelyn Hillock, FNP  fluticasone (FLONASE) 50 MCG/ACT nasal spray Place 1 spray into both nostrils daily for 14 days. 05/21/19 06/04/19  Jonmichael Beadnell, Darrelyn Hillock, FNP  OVER THE COUNTER MEDICATION Take 1 Dose by mouth 2 (two) times daily. Sugar Bear Hair    [provider]  pravastatin (PRAVACHOL) 20 MG tablet Take 1 tablet (20 mg total) by mouth daily. 02/16/18   Caren Macadam, MD    Family History Family History  Problem Relation  Age of Onset  . Asthma Mother   . COPD Mother   . Hashimoto's thyroiditis Mother   . Brain cancer Father   . Thyroid cancer Sister   . Hypothyroidism Sister   . CAD Paternal Grandfather     Social History Social History   Tobacco Use  . Smoking status: Current Every Day Smoker    Packs/day: 0.50    Years: 26.00    Pack years: 13.00  . Smokeless tobacco: Never Used  . Tobacco comment: Chantix did not work-SI; welbutrin caused constipation  Substance Use Topics  . Alcohol use: No  . Drug use: No     Allergies   Latex and Penicillins   Review of Systems Review of Systems  Constitutional: Negative.   HENT: Positive for ear pain, sinus pressure and sinus pain.   Respiratory: Negative.   Cardiovascular: Negative.   Neurological: Negative.   ROS: All other are negatives  Physical Exam Triage Vital Signs ED Triage Vitals  Enc Vitals Group     BP 05/21/19 1204 128/75     Pulse Rate 05/21/19 1204 87     Resp 05/21/19 1204 18     Temp 05/21/19 1204 98.7 F (37.1 C)     Temp src --      SpO2 05/21/19 1204 95 %  Weight --      Height --      Head Circumference --      Peak Flow --      Pain Score 05/21/19 1202 0     Pain Loc --      Pain Edu? --      Excl. in [place]? --    No data found.  Updated Vital Signs BP 128/75   Pulse 87   Temp 98.7 F (37.1 C)   Resp 18   SpO2 95%   Visual Acuity Right Eye Distance:   Left Eye Distance:   Bilateral Distance:    Right Eye Near:   Left Eye Near:    Bilateral Near:     Physical Exam Constitutional:      General: She is not in acute distress.    Appearance: Normal appearance. She is normal weight. She is not ill-appearing or toxic-appearing.  HENT:     Head: Normocephalic.     Comments: Maxillary sinuses tenderness     Right Ear: Tympanic membrane, ear canal and external ear normal. There is no impacted cerumen.     Left Ear: Tympanic membrane, ear canal and external ear normal. There is no impacted  cerumen.     Nose: Nose normal. No congestion.     Mouth/Throat:     Mouth: Mucous membranes are moist.     Pharynx: No oropharyngeal exudate or posterior oropharyngeal erythema.  Cardiovascular:     Rate and Rhythm: Normal rate and regular rhythm.     Pulses: Normal pulses.     Heart sounds: Normal heart sounds. No murmur.  Pulmonary:     Effort: Pulmonary effort is normal. No respiratory distress.     Breath sounds: No wheezing or rhonchi.  Chest:     Chest wall: No tenderness.  Abdominal:     General: Abdomen is flat. Bowel sounds are normal. There is no distension.     Palpations: There is no mass.  Skin:    Capillary Refill: Capillary refill takes less than 2 seconds.  Neurological:     Mental Status: She is alert and oriented to person, place, and time.      UC Treatments / Results  Labs (all labs ordered are listed, but only abnormal results are displayed) Labs Reviewed  NOVEL CORONAVIRUS, NAA    EKG   Radiology No results found.  Procedures Procedures (including critical care time)  Medications Ordered in UC Medications - No data to display  Initial Impression / Assessment and Plan / UC Course  I have reviewed the triage vital signs and the nursing notes.  Pertinent labs & imaging results that were available during my care of the patient were reviewed by me and considered in my medical decision making (see chart for details).    Her hx, symptoms, and PE are consistent with sinusitis.  Prescribed Doxycycline.  Will follow up with PCP if symptoms persists.  Return and ER precautions   Final Clinical Impressions(s) / UC Diagnoses   Final diagnoses:  Suspected 2019 novel coronavirus infection  Acute non-recurrent maxillary sinusitis     Discharge Instructions     Advised patient to take medication as prescribed Take and complete the antibiotic order Drink plenty of water Use Flonase as prescribed To return if symptoms get worse    ED  Prescriptions    Medication Sig Dispense Auth. Provider   fluticasone (FLONASE) 50 MCG/ACT nasal spray Place 1 spray into both nostrils daily for 14  days. 16 g Lahoma Constantin, Darrelyn Hillock, FNP   doxycycline (VIBRAMYCIN) 100 MG capsule Take 1 capsule (100 mg total) by mouth 2 (two) times daily for 7 days. 20 capsule Burhan Barham, Darrelyn Hillock, FNP     PDMP not reviewed this encounter.   Emerson Monte, [place] 05/21/19 1317

## 2019-05-23 LAB — NOVEL CORONAVIRUS, NAA: SARS-CoV-2, NAA: NOT DETECTED

## 2019-08-27 DIAGNOSIS — Z1329 Encounter for screening for other suspected endocrine disorder: Secondary | ICD-10-CM | POA: Diagnosis not present

## 2019-08-27 DIAGNOSIS — Z131 Encounter for screening for diabetes mellitus: Secondary | ICD-10-CM | POA: Diagnosis not present

## 2019-08-27 DIAGNOSIS — Z13220 Encounter for screening for lipoid disorders: Secondary | ICD-10-CM | POA: Diagnosis not present

## 2019-09-03 ENCOUNTER — Other Ambulatory Visit: Payer: Self-pay

## 2019-09-03 ENCOUNTER — Encounter (INDEPENDENT_AMBULATORY_CARE_PROVIDER_SITE_OTHER): Payer: Self-pay | Admitting: Internal Medicine

## 2019-09-03 ENCOUNTER — Ambulatory Visit (INDEPENDENT_AMBULATORY_CARE_PROVIDER_SITE_OTHER): Payer: BC Managed Care – PPO | Admitting: Internal Medicine

## 2019-09-03 VITALS — BP 120/80 | HR 88 | Temp 97.1°F | Resp 18 | Ht 63.0 in | Wt 161.0 lb

## 2019-09-03 DIAGNOSIS — R5381 Other malaise: Secondary | ICD-10-CM

## 2019-09-03 DIAGNOSIS — R5383 Other fatigue: Secondary | ICD-10-CM | POA: Diagnosis not present

## 2019-09-03 DIAGNOSIS — E782 Mixed hyperlipidemia: Secondary | ICD-10-CM

## 2019-09-03 DIAGNOSIS — Z79890 Hormone replacement therapy (postmenopausal): Secondary | ICD-10-CM | POA: Diagnosis not present

## 2019-09-03 DIAGNOSIS — R6882 Decreased libido: Secondary | ICD-10-CM

## 2019-09-03 DIAGNOSIS — F419 Anxiety disorder, unspecified: Secondary | ICD-10-CM

## 2019-09-03 DIAGNOSIS — E559 Vitamin D deficiency, unspecified: Secondary | ICD-10-CM | POA: Diagnosis not present

## 2019-09-03 DIAGNOSIS — R232 Flushing: Secondary | ICD-10-CM

## 2019-09-03 NOTE — Progress Notes (Signed)
Metrics: Intervention Frequency ACO  Documented Smoking Status Yearly  Screened one or more times in 24 months  Cessation Counseling or  Active cessation medication Past 24 months  Past 24 months   Guideline developer: UpToDate (See UpToDate for funding source) Date Released: 2014       Wellness Office Visit  Subjective:  Patient ID: Briana Clark, female    DOB: 1971-04-28  Age: 49 y.o. MRN: [id number]  CC: This very pleasant 49 year old lady comes to our practice as a new patient to be established.  HPI  Her main concerns appears to be air loss, fatigue, poor focus and concentration and degree of weight gain.  She had lost 20 pounds.  Seems to have gained some of this back. She also describes hot flashes, night sweats.  The symptoms of been present for probably about a year has have symptoms of decreased libido. She has a strong family history of thyroid disease in her mother, sister and brother. She also has hyperlipidemia and statin therapy has been tried in the past but she is unable to tolerate it with severe myalgias.  Her total cholesterol measured recently was 270.  Thankfully, she has no history of coronary artery disease, cerebrovascular disease. Unfortunately, she is a longstanding smoker of a half a pack of cigarettes per day.  She realizes she needs to quit smoking. Past Medical History:  Diagnosis Date  . Hemangioma    right side/UE and LE  . Hyperlipidemia       Family History  Problem Relation Age of Onset  . Asthma Mother   . COPD Mother   . Hashimoto's thyroiditis Mother   . Brain cancer Father   . Thyroid cancer Sister   . Hypothyroidism Sister   . Graves' disease Sister   . Goiter Brother   . CAD Paternal Grandfather     Social History   Social History Narrative   Lives in [place], [place].   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    Dogs, walks them regularly.    Eats all foods.   Wear seatbelt.    Enjoys reading and make wreaths.    Social History   Tobacco Use  . Smoking status: Current Every Day Smoker    Packs/day: 0.50    Years: 26.00    Pack years: 13.00  . Smokeless tobacco: Never Used  . Tobacco comment: Chantix did not work-SI; welbutrin caused constipation  Substance Use Topics  . Alcohol use: Yes    Comment: occ    Current Meds  Medication Sig  . FLUoxetine (PROZAC) 40 MG capsule Take 1 capsule (40 mg total) by mouth daily.  Marland Kitchen loratadine-pseudoephedrine (CLARITIN-D 24-HOUR) 10-240 MG 24 hr tablet Take 1 tablet by mouth daily.       Objective:   Today's Vitals: BP 120/80 (BP Location: Left Arm, Patient Position: Sitting, Cuff Size: Small)   Pulse 88   Temp (!) 97.1 F (36.2 C) (Temporal)   Resp 18   Ht 5\' 3"  (1.6 m)   Wt 161 lb (73 kg)   SpO2 96%   BMI 28.52 kg/m  Vitals with BMI 09/03/2019 05/21/2019 02/16/2018  Height 5\' 3"  - 5' 2.5"  Weight 161 lbs - 167 lbs 1 oz  BMI 28.53 - 30.05  Systolic 120 128 102  Diastolic 80 75 70  Pulse 88 87 90     Physical Exam  She looks systemically well.  She is overweight.  Blood pressure is well controlled.  She is alert and orientated without any obvious focal neurological signs.     Assessment   1. Mixed hyperlipidemia   2. Anxiety   3. Malaise and fatigue   4. Vitamin D deficiency disease   5. Decreased libido   6. Hot flashes       Tests ordered Orders Placed This Encounter  Procedures  . CBC  . Estradiol  . Progesterone  . Testos,Total,Free and SHBG (Female)  . T3, free  . TSH  . T4  . VITAMIN D 25 Hydroxy (Vit-D Deficiency, Fractures)  . Follicle stimulating hormone     Plan: 1. Blood work is ordered above. 2. I discussed tobacco cessation with her and gave her recommendations regarding this.  If she can wean off cigarettes on a weekly basis, she may be successful.  She had tried Chantix which gave her suicidal thoughts previously.  She does not want to try this  again. 3. With her symptoms of hot flashes and night sweats, she appears to be clearly in the menopause.  She had a hysterectomy sometime ago but ovaries were left in.  We will check estradiol and progesterone levels.  We will also check FSH levels. 4. I will check a testosterone level in view of her decreased libido. 5. I discussed the philosophy of the practice based on nutrition, exercise and palatable hormone therapy and she is agreeable to this way of being treated. 6. I will see in the next several weeks for an annual physical exam when we will discuss all her blood work and further recommendations. 7. Today I spent 45 minutes with this patient discussing all of the above.   No orders of the defined types were placed in this encounter.   Wilson Singer, MD

## 2019-09-07 LAB — PROGESTERONE: Progesterone: <0.5 ng/mL

## 2019-09-07 LAB — CBC
HCT: 36.6 % (ref 35.0–45.0)
Hemoglobin: 11.5 g/dL — ABNORMAL LOW (ref 11.7–15.5)
MCH: 24.6 pg — ABNORMAL LOW (ref 27.0–33.0)
MCHC: 31.4 g/dL — ABNORMAL LOW (ref 32.0–36.0)
MCV: 78.4 fL — ABNORMAL LOW (ref 80.0–100.0)
MPV: 10.4 fL (ref 7.5–12.5)
Platelets: 387 10*3/uL (ref 140–400)
RBC: 4.67 10*6/uL (ref 3.80–5.10)
RDW: 14.7 % (ref 11.0–15.0)
WBC: 7.1 10*3/uL (ref 3.8–10.8)

## 2019-09-07 LAB — T4: T4, Total: 7.4 ug/dL (ref 5.1–11.9)

## 2019-09-07 LAB — TESTOS,TOTAL,FREE AND SHBG (FEMALE)
Free Testosterone: 1.6 pg/mL (ref 0.1–6.4)
Sex Hormone Binding: 65 nmol/L (ref 17–124)
Testosterone, Total, LC-MS-MS: 15 ng/dL (ref 2–45)

## 2019-09-07 LAB — ESTRADIOL: Estradiol: <15 pg/mL

## 2019-09-07 LAB — T3, FREE: T3, Free: 3.3 pg/mL (ref 2.3–4.2)

## 2019-09-07 LAB — FOLLICLE STIMULATING HORMONE: FSH: 79.7 m[IU]/mL

## 2019-09-07 LAB — TSH: TSH: 1.33 mIU/L

## 2019-09-07 LAB — VITAMIN D 25 HYDROXY (VIT D DEFICIENCY, FRACTURES): Vit D, 25-Hydroxy: 12 ng/mL — ABNORMAL LOW (ref 30–100)

## 2019-10-03 ENCOUNTER — Ambulatory Visit (INDEPENDENT_AMBULATORY_CARE_PROVIDER_SITE_OTHER): Payer: BC Managed Care – PPO | Admitting: Internal Medicine

## 2019-10-03 ENCOUNTER — Encounter (INDEPENDENT_AMBULATORY_CARE_PROVIDER_SITE_OTHER): Payer: Self-pay | Admitting: Internal Medicine

## 2019-10-03 ENCOUNTER — Other Ambulatory Visit: Payer: Self-pay

## 2019-10-03 VITALS — BP 118/66 | HR 105 | Temp 97.7°F | Resp 18 | Ht 62.0 in | Wt 165.0 lb

## 2019-10-03 DIAGNOSIS — R5381 Other malaise: Secondary | ICD-10-CM | POA: Diagnosis not present

## 2019-10-03 DIAGNOSIS — R232 Flushing: Secondary | ICD-10-CM

## 2019-10-03 DIAGNOSIS — R5383 Other fatigue: Secondary | ICD-10-CM | POA: Diagnosis not present

## 2019-10-03 DIAGNOSIS — E782 Mixed hyperlipidemia: Secondary | ICD-10-CM

## 2019-10-03 DIAGNOSIS — E559 Vitamin D deficiency, unspecified: Secondary | ICD-10-CM

## 2019-10-03 DIAGNOSIS — E538 Deficiency of other specified B group vitamins: Secondary | ICD-10-CM | POA: Diagnosis not present

## 2019-10-03 DIAGNOSIS — R6882 Decreased libido: Secondary | ICD-10-CM

## 2019-10-03 DIAGNOSIS — D649 Anemia, unspecified: Secondary | ICD-10-CM

## 2019-10-03 MED ORDER — ESTRADIOL 0.5 MG PO TABS
0.5000 mg | ORAL_TABLET | Freq: Every day | ORAL | 3 refills | Status: DC
Start: 2019-10-03 — End: 2020-01-08

## 2019-10-03 MED ORDER — PROGESTERONE MICRONIZED 100 MG PO CAPS
100.0000 mg | ORAL_CAPSULE | Freq: Every day | ORAL | 3 refills | Status: DC
Start: 2019-10-03 — End: 2020-01-08

## 2019-10-03 NOTE — Progress Notes (Signed)
Metrics: Intervention Frequency ACO  Documented Smoking Status Yearly  Screened one or more times in 24 months  Cessation Counseling or  Active cessation medication Past 24 months  Past 24 months   Guideline developer: UpToDate (See UpToDate for funding source) Date Released: 2014       Wellness Office Visit  Subjective:  Patient ID: Briana Clark, female    DOB: Jun 28, 1970  Age: 49 y.o. MRN: [id number]  CC: This lady comes in to discuss all her blood results.  She was scheduled to have a physical but she did not realize that this was her physical appointment so we have converted him into an office visit. HPI  I discussed all the results with her.  She has vitamin D deficiency.  She is clearly in the menopause. She is also anemic mildly with microcytosis.  She denies any rectal bleeding, melena, nasal bleeding.  She has already had a hysterectomy so this is somewhat surprising.  She does describe some nonspecific epigastric discomfort/pain. Past Medical History:  Diagnosis Date  . Hemangioma    right side/UE and LE  . Hyperlipidemia       Family History  Problem Relation Age of Onset  . Asthma Mother   . COPD Mother   . Hashimoto's thyroiditis Mother   . Brain cancer Father   . Thyroid cancer Sister   . Hypothyroidism Sister   . Graves' disease Sister   . Goiter Brother   . CAD Paternal Grandfather     Social History   Social History Narrative   Lives in [place], [place].   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    Dogs, walks them regularly.    Eats all foods.   Wear seatbelt.   Enjoys reading and make wreaths.    Social History   Tobacco Use  . Smoking status: Current Every Day Smoker    Packs/day: 0.50    Years: 26.00    Pack years: 13.00  . Smokeless tobacco: Never Used  . Tobacco comment: Chantix did not work-SI; welbutrin caused constipation  Substance Use Topics  . Alcohol use: Yes    Comment: occ      Current Meds  Medication Sig  . FLUoxetine (PROZAC) 40 MG capsule Take 1 capsule (40 mg total) by mouth daily.  Marland Kitchen loratadine-pseudoephedrine (CLARITIN-D 24-HOUR) 10-240 MG 24 hr tablet Take 1 tablet by mouth daily.      Objective:   Today's Vitals: BP 118/66 (BP Location: Left Arm, Patient Position: Sitting, Cuff Size: Normal)   Pulse (!) 105   Temp 97.7 F (36.5 C) (Temporal)   Resp 18   Ht 5\' 2"  (1.575 m)   Wt 165 lb (74.8 kg)   SpO2 98%   BMI 30.18 kg/m  Vitals with BMI 10/03/2019 09/03/2019 05/21/2019  Height 5\' 2"  5\' 3"  -  Weight 165 lbs 161 lbs -  BMI 30.17 28.53 -  Systolic 118 120 244  Diastolic 66 80 75  Pulse 105 88 87     Physical Exam   She looks systemically well.  She is obese.  No other new physical findings.    Assessment   1. Hot flashes   2. Decreased libido   3. Vitamin D deficiency disease   4. Malaise and fatigue   5. Mixed hyperlipidemia   6. Anemia, unspecified type       Tests ordered Orders Placed This Encounter  Procedures  . B12 and  Folate Panel  . Iron and TIBC     Plan: 1. As far as her anemia is concerned, I will check hematinics above. 2. I discussed her symptoms of menopause which she clearly is in at the present time and discussed bioidentical hormone therapy.  I discussed the women's health initiative study and why bioidentical hormone therapy is safer.  I discussed side effects of estradiol and progesterone and benefits.  She would like to proceed and I have sent a prescription to her pharmacy for estradiol and progesterone. 3. She also would like to start testosterone therapy after I discussed this with her.  I discussed the possible side effects and benefits and she would like to try an injection so [facility]  injections 5 mg intramuscular once a week.  She will come back to the office for education on administration. 4. I recommended that she start taking  vitamin D3 10,000 units daily. 5. I will see her in about 6 weeks time to see how she is doing and we will check levels again. 6. Today I spent 45 minutes with this patient discussing all her results and recommendations regarding bioidentical hormone therapy.   Meds ordered this encounter  Medications  . estradiol (ESTRACE) 0.5 MG tablet    Sig: Take 1 tablet (0.5 mg total) by mouth daily.    Dispense:  30 tablet    Refill:  3  . progesterone (PROMETRIUM) 100 MG capsule    Sig: Take 1 capsule (100 mg total) by mouth daily.    Dispense:  30 capsule    Refill:  3    Suede Greenawalt Normajean Glasgow, MD

## 2019-10-03 NOTE — Patient Instructions (Addendum)
[facility] Dietary Recommendations for Weight Loss What to Avoid . Avoid added sugars o Often added sugar can be found in processed foods such as many condiments, dry cereals, cakes, cookies, chips, crisps, crackers, candies, sweetened drinks, etc.  o Read labels and AVOID/DECREASE use of foods with the following in their ingredient list: Sugar, fructose, high fructose corn syrup, sucrose, glucose, maltose, dextrose, molasses, cane sugar, brown sugar, any type of syrup, agave nectar, etc.   . Avoid snacking in between meals . Avoid foods made with flour o If you are going to eat food made with flour, choose those made with whole-grains; and, minimize your consumption as much as is tolerable . Avoid processed foods o These foods are generally stocked in the middle of the grocery store. Focus on shopping on the perimeter of the grocery.  . Avoid Meat  o We recommend following a plant-based diet at [facility]. Thus, we recommend avoiding meat as a general rule. Consider eating beans, legumes, eggs, and/or dairy products for regular protein sources o If you plan on eating meat limit to 4 ounces of meat at a time and choose lean options such as Fish, chicken, turkey. Avoid red meat intake such as pork and/or steak What to Include . Vegetables o GREEN LEAFY VEGETABLES: Kale, spinach, mustard greens, collard greens, cabbage, broccoli, etc. o OTHER: Asparagus, cauliflower, eggplant, carrots, peas, Brussel sprouts, tomatoes, bell peppers, zucchini, beets, cucumbers, etc. . Grains, seeds, and legumes o Beans: kidney beans, black eyed peas, garbanzo beans, black beans, pinto beans, etc. o Whole, unrefined grains: brown rice, barley, bulgur, oatmeal, etc. . Healthy fats  o Avoid highly processed fats such as vegetable oil o Examples of healthy fats: avocado, olives, virgin olive oil, dark chocolate (?72% Cocoa), nuts (peanuts, almonds, walnuts, cashews, pecans, etc.) . None to Low  Intake of Animal Sources of Protein o Meat sources: chicken, turkey, salmon, tuna. Limit to 4 ounces of meat at one time. o Consider limiting dairy sources, but when choosing dairy focus on: PLAIN Greek yogurt, cottage cheese, high-protein milk . Fruit o Choose berries  When to Eat . Intermittent Fasting: o Choosing not to eat for a specific time period, but DO FOCUS ON HYDRATION when fasting o Multiple Techniques: - Time Restricted Eating: eat 3 meals in a day, each meal lasting no more than 60 minutes, no snacks between meals - 16-18 hour fast: fast for 16 to 18 hours up to 7 days a week. Often suggested to start with 2-3 nonconsecutive days per week.  . Remember the time you sleep is counted as fasting.  . Examples of eating schedule: Fast from 7:00pm-11:00am. Eat between 11:00am-7:00pm.  - 24-hour fast: fast for 24 hours up to every other day. Often suggested to start with 1 day per week . Remember the time you sleep is counted as fasting . Examples of eating schedule:  o Eating day: eat 2-3 meals on your eating day. If doing 2 meals, each meal should last no more than 90 minutes. If doing 3 meals, each meal should last no more than 60 minutes. Finish last meal by 7:00pm. o Fasting day: Fast until 7:00pm.  o IF YOU FEEL UNWELL FOR ANY REASON/IN ANY WAY WHEN FASTING, STOP FASTING BY EATING A NUTRITIOUS SNACK OR LIGHT MEAL o ALWAYS FOCUS ON HYDRATION DURING FASTS - Acceptable Hydration sources: water, broths, tea/coffee (black tea/coffee is best but using a small amount of whole-fat dairy products in coffee/tea is acceptable).  -   Poor Hydration Sources: anything with sugar or artificial sweeteners added to it  These recommendations have been developed for patients that are actively receiving medical care from either Dr. Anastasio Champion or Jeralyn Ruths, DNP, NP-C at [facility]. These recommendations are developed for patients with specific medical conditions and are not meant to be  distributed or used by others that are not actively receiving care from either provider listed above at [facility]. It is not appropriate to participate in the above eating plans without proper medical supervision.   Reference: Briana Clark. The obesity code. [place]; 2016.   VITAMIN D3 10,000 UNITS/DAY  [email or web address]

## 2019-10-04 LAB — B12 AND FOLATE PANEL
Folate: 6.6 ng/mL
Vitamin B-12: 238 pg/mL (ref 200–1100)

## 2019-10-04 LAB — IRON, TOTAL/TOTAL IRON BINDING CAP
%SAT: 10 % (calc) — ABNORMAL LOW (ref 16–45)
Iron: 40 ug/dL (ref 40–190)
TIBC: 416 mcg/dL (calc) (ref 250–450)

## 2019-10-04 NOTE — Progress Notes (Signed)
Please call this patient and let her know that she may have a mild degree of iron deficiency and she needs to see a gastroenterologist.  I believe she did see a gastroenterologist in the past and we can send her to the same one  if that is what she wants.  Let me know.

## 2019-10-08 ENCOUNTER — Other Ambulatory Visit (INDEPENDENT_AMBULATORY_CARE_PROVIDER_SITE_OTHER): Payer: Self-pay

## 2019-10-08 ENCOUNTER — Ambulatory Visit (INDEPENDENT_AMBULATORY_CARE_PROVIDER_SITE_OTHER): Payer: BC Managed Care – PPO

## 2019-10-08 ENCOUNTER — Other Ambulatory Visit: Payer: Self-pay

## 2019-10-08 VITALS — Ht 62.0 in | Wt 165.0 lb

## 2019-10-08 DIAGNOSIS — D509 Iron deficiency anemia, unspecified: Secondary | ICD-10-CM

## 2019-10-08 DIAGNOSIS — R5381 Other malaise: Secondary | ICD-10-CM

## 2019-10-08 DIAGNOSIS — R6882 Decreased libido: Secondary | ICD-10-CM

## 2019-10-08 DIAGNOSIS — R5383 Other fatigue: Secondary | ICD-10-CM

## 2019-10-08 DIAGNOSIS — R232 Flushing: Secondary | ICD-10-CM

## 2019-10-08 MED ORDER — NONFORMULARY OR COMPOUNDED ITEM: 0.2000 mL | Freq: Once | Status: AC

## 2019-10-08 NOTE — Progress Notes (Signed)
Pt return call to notify to send her back to Dr Earlean Shawl for the iron deficiency.

## 2019-10-08 NOTE — Progress Notes (Signed)
1 st testosterone injection; lft thigh 0.27mL.Pt tolerated well; no complaints.

## 2019-10-08 NOTE — Progress Notes (Signed)
Patient called.  Left a message of lab results. Pt was ask to call back to send referral to GI provider of her choice for iron deficiency.

## 2019-10-15 ENCOUNTER — Telehealth (INDEPENDENT_AMBULATORY_CARE_PROVIDER_SITE_OTHER): Payer: Self-pay

## 2019-10-15 NOTE — Telephone Encounter (Signed)
Pt LVM wanting a call back regarding a referral, she hasnt heard anything about

## 2019-11-07 ENCOUNTER — Ambulatory Visit (INDEPENDENT_AMBULATORY_CARE_PROVIDER_SITE_OTHER): Payer: BC Managed Care – PPO | Admitting: Internal Medicine

## 2020-01-08 ENCOUNTER — Other Ambulatory Visit (INDEPENDENT_AMBULATORY_CARE_PROVIDER_SITE_OTHER): Payer: Self-pay | Admitting: Internal Medicine

## 2020-01-08 NOTE — Telephone Encounter (Signed)
Patient needs an appt before we can refill her medications. Please try to get her scheduled for an appointment within the next 30 days. I will send 1 month supply, but will not refill any further if she is not seen.

## 2020-03-26 ENCOUNTER — Encounter: Payer: Self-pay | Admitting: Emergency Medicine

## 2020-03-26 ENCOUNTER — Ambulatory Visit
Admission: EM | Admit: 2020-03-26 | Discharge: 2020-03-26 | Disposition: A | Discharge status: Home / Self Care | Payer: BC Managed Care – PPO | Attending: Emergency Medicine | Admitting: Emergency Medicine

## 2020-03-26 ENCOUNTER — Other Ambulatory Visit: Payer: Self-pay

## 2020-03-26 DIAGNOSIS — R110 Nausea: Secondary | ICD-10-CM

## 2020-03-26 DIAGNOSIS — G43009 Migraine without aura, not intractable, without status migrainosus: Secondary | ICD-10-CM

## 2020-03-26 MED ORDER — KETOROLAC TROMETHAMINE 30 MG/ML IJ SOLN
30.0000 mg | Freq: Once | INTRAMUSCULAR | Status: AC
  Administered 2020-03-26: 30 mg via INTRAMUSCULAR

## 2020-03-26 MED ORDER — DEXAMETHASONE SODIUM PHOSPHATE 10 MG/ML IJ SOLN
10.0000 mg | Freq: Once | INTRAMUSCULAR | Status: AC
  Administered 2020-03-26: 10 mg via INTRAMUSCULAR

## 2020-03-26 MED ORDER — ONDANSETRON 4 MG PO TBDP
4.0000 mg | ORAL_TABLET | Freq: Once | ORAL | Status: AC
  Administered 2020-03-26: 4 mg via ORAL

## 2020-03-26 MED ORDER — ONDANSETRON HCL 4 MG PO TABS: 4.0000 mg | ORAL_TABLET | Freq: Four times a day (QID) | ORAL | 0 refills | Status: DC

## 2020-03-26 NOTE — ED Triage Notes (Signed)
Migraine and nausea x 2 days.  Hx of migraines

## 2020-03-26 NOTE — Discharge Instructions (Signed)
Migraine cocktail given in office Rest and drink plenty of fluids Use OTC medications as needed for symptomatic relief Zofran as needed for nausea Follow up with PCP if symptoms persists Return or go to the ER if you have any new or worsening symptoms such as fever, chills, nausea, vomiting, chest pain, shortness of breath, cough, vision changes, worsening headache despite treatment, slurred speech, facial asymmetry, weakness in arms or legs, etc..Marland Kitchen

## 2020-03-26 NOTE — ED Provider Notes (Signed)
[facility]   [id number] 03/26/20 Arrival Time: 1250  [id number]  SUBJECTIVE:  Briana Clark is a 50 y.o. female who complains of migraine for x 2 days.  Denies a precipitating event, or recent head trauma.  Patient localizes her pain to the forehead.  Describes the pain as constant and throbbing in character.  Patient has tried OTC medications without relief. Symptoms are made worse with light.  Reports similar symptoms in the past.  This is not the worst headache of their life. Complains of nausea, and photophobia. Patient denies fever, chills, vomiting, aura, rhinorrhea, watery eyes, chest pain, SOB, abdominal pain, weakness, numbness or tingling, slurred speech.    ROS: As per HPI.  All other pertinent ROS negative.     Past Medical History:  Diagnosis Date  . Hemangioma    right side/UE and LE  . Hyperlipidemia    Past Surgical History:  Procedure Laterality Date  . ABDOMINAL HYSTERECTOMY    . TONSILLECTOMY     Allergies  Allergen Reactions  . Latex   . Penicillins    Current Facility-Administered Medications on File Prior to Encounter  Medication Dose Route Frequency Provider Last Rate Last Admin  . NONFORMULARY OR COMPOUNDED ITEM 0.2 mL  0.2 mL Intramuscular Once Doree Albee, MD       Current Outpatient Medications on File Prior to Encounter  Medication Sig Dispense Refill  . estradiol (ESTRACE) 0.5 MG tablet TAKE 1 TABLET(0.5 MG) BY MOUTH DAILY 30 tablet 0  . FLUoxetine (PROZAC) 40 MG capsule Take 1 capsule (40 mg total) by mouth daily. 90 capsule 0  . loratadine-pseudoephedrine (CLARITIN-D 24-HOUR) 10-240 MG 24 hr tablet Take 1 tablet by mouth daily.    . progesterone (PROMETRIUM) 100 MG capsule TAKE 1 CAPSULE(100 MG) BY MOUTH DAILY 30 capsule 0   Social History   Socioeconomic History  . Marital status: Married    Spouse name: Not on file  . Number of children: Not on file  . Years of education: Not on file  . Highest education level: Not  on file  Occupational History  . Not on file  Tobacco Use  . Smoking status: Current Every Day Smoker    Packs/day: 0.50    Years: 26.00    Pack years: 13.00  . Smokeless tobacco: Never Used  . Tobacco comment: Chantix did not work-SI; welbutrin caused constipation  Vaping Use  . Vaping Use: Never used  Substance and Sexual Activity  . Alcohol use: Yes    Comment: occ  . Drug use: No  . Sexual activity: Yes    Birth control/protection: Surgical  Other Topics Concern  . Not on file  Social History Narrative   Lives in [place], [place].   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    Dogs, walks them regularly.    Eats all foods.   Wear seatbelt.   Enjoys reading and make wreaths.    Social Determinants of Health   Financial Resource Strain:   . Difficulty of Paying Living Expenses: Not on file  Food Insecurity:   . Worried About Charity fundraiser in the Last Year: Not on file  . Ran Out of Food in the Last Year: Not on file  Transportation Needs:   . Lack of Transportation (Medical): Not on file  . Lack of Transportation (Non-Medical): Not on file  Physical Activity:   . Days of Exercise per Week: Not on  file  . Minutes of Exercise per Session: Not on file  Stress:   . Feeling of Stress : Not on file  Social Connections:   . Frequency of Communication with Friends and Family: Not on file  . Frequency of Social Gatherings with Friends and Family: Not on file  . Attends Religious Services: Not on file  . Active Member of Clubs or Organizations: Not on file  . Attends Archivist Meetings: Not on file  . Marital Status: Not on file  Intimate Partner Violence:   . Fear of Current or Ex-Partner: Not on file  . Emotionally Abused: Not on file  . Physically Abused: Not on file  . Sexually Abused: Not on file   Family History  Problem Relation Age of Onset  . Asthma Mother   . COPD Mother   . Hashimoto's  thyroiditis Mother   . Brain cancer Father   . Thyroid cancer Sister   . Hypothyroidism Sister   . Graves' disease Sister   . Goiter Brother   . CAD Paternal Grandfather     OBJECTIVE:  Vitals:   03/26/20 1344 03/26/20 1346  BP: 120/80   Pulse: (!) 102   Resp: 17   Temp: 98.4 F (36.9 C)   TempSrc: Oral   SpO2: 97%   Weight:  163 lb 2.3 oz (74 kg)  Height:  5\' 2"  (1.575 m)    General appearance: alert; appears uncomfortable sitting in dark room Eyes: PERRLA; EOMI HENT: normocephalic; atraumatic Neck: supple with FROM Lungs: clear to auscultation bilaterally Heart: regular rate and rhythm.   Extremities: no edema; symmetrical with no gross deformities Skin: warm and dry Neurologic: CN 2-12 grossly intact; finger to nose without difficulty; normal gait; strength and sensation intact bilaterally about the upper and lower extremities; negative pronator drift Psychological: alert and cooperative; normal mood and affect  ASSESSMENT & PLAN:  1. Migraine without aura and without status migrainosus, not intractable   2. Nausea without vomiting     Meds ordered this encounter  Medications  . ketorolac (TORADOL) 30 MG/ML injection 30 mg  . dexamethasone (DECADRON) injection 10 mg  . ondansetron (ZOFRAN-ODT) disintegrating tablet 4 mg  . ondansetron (ZOFRAN) 4 MG tablet    Sig: Take 1 tablet (4 mg total) by mouth every 6 (six) hours.    Dispense:  12 tablet    Refill:  0    Order Specific Question:   Supervising Provider    Answer:   Raylene Everts [[id number]]   Migraine cocktail given in office Rest and drink plenty of fluids Use OTC medications as needed for symptomatic relief Zofran as needed for nausea Follow up with PCP if symptoms persists Return or go to the ER if you have any new or worsening symptoms such as fever, chills, nausea, vomiting, chest pain, shortness of breath, cough, vision changes, worsening headache despite treatment, slurred speech, facial  asymmetry, weakness in arms or legs, etc...  Reviewed expectations re: course of current medical issues. Questions answered. Outlined signs and symptoms indicating need for more acute intervention. Patient verbalized understanding. After Visit Summary given.   Lestine Box, PA-C 03/26/20 1411

## 2020-04-08 DIAGNOSIS — L600 Ingrowing nail: Secondary | ICD-10-CM | POA: Diagnosis not present

## 2020-07-02 DIAGNOSIS — J111 Influenza due to unidentified influenza virus with other respiratory manifestations: Secondary | ICD-10-CM | POA: Diagnosis not present

## 2020-07-02 DIAGNOSIS — J329 Chronic sinusitis, unspecified: Secondary | ICD-10-CM | POA: Diagnosis not present

## 2020-07-02 DIAGNOSIS — J019 Acute sinusitis, unspecified: Secondary | ICD-10-CM | POA: Diagnosis not present

## 2020-08-19 DIAGNOSIS — J069 Acute upper respiratory infection, unspecified: Secondary | ICD-10-CM | POA: Diagnosis not present

## 2020-08-19 DIAGNOSIS — J019 Acute sinusitis, unspecified: Secondary | ICD-10-CM | POA: Diagnosis not present

## 2020-10-03 DIAGNOSIS — M5412 Radiculopathy, cervical region: Secondary | ICD-10-CM | POA: Diagnosis not present

## 2020-12-10 DIAGNOSIS — H6092 Unspecified otitis externa, left ear: Secondary | ICD-10-CM | POA: Diagnosis not present

## 2021-02-15 ENCOUNTER — Ambulatory Visit
Admission: EM | Admit: 2021-02-15 | Discharge: 2021-02-15 | Disposition: A | Discharge status: Home / Self Care | Payer: BC Managed Care – PPO | Attending: Family Medicine | Admitting: Family Medicine

## 2021-02-15 ENCOUNTER — Encounter: Payer: Self-pay | Admitting: Emergency Medicine

## 2021-02-15 DIAGNOSIS — J0141 Acute recurrent pansinusitis: Secondary | ICD-10-CM

## 2021-02-15 MED ORDER — PREDNISONE 20 MG PO TABS: 20.0000 mg | ORAL_TABLET | Freq: Every day | ORAL | 0 refills | Status: AC

## 2021-02-15 MED ORDER — DOXYCYCLINE HYCLATE 100 MG PO CAPS: 100.0000 mg | ORAL_CAPSULE | Freq: Two times a day (BID) | ORAL | 0 refills | Status: DC

## 2021-02-15 NOTE — ED Provider Notes (Signed)
[facility]    CSN: [id number] Arrival date & time: 02/15/21  1020      History   Chief Complaint No chief complaint on file.   HPI Briana Clark is a 50 y.o. female.   HPI Patient presents with concern for possible sinus infection.  Onset: >7  days. This is a recurrent problem that patient reports occurring at least 1-2 times per year. Endorse facial pressure, frontal headache, ear pressure, copious mucus with blood tinged mucus . Denies fever, ST, chest tightness, eye irritation,  or GI symptoms. Attempted relief with OTC medication without any relief of symptoms. Patient is a smoker.  Patient reports multiple negative COVID-19 test at home.  She has not had fever and does not have a persistent cough.  Remainder of Review of Systems negative except as noted in the HPI.    Past Medical History:  Diagnosis Date   Hemangioma    right side/UE and LE   Hyperlipidemia     Patient Active Problem List   Diagnosis Date Noted   Anxiety 02/16/2018   Tobacco abuse counseling 02/16/2018   Mixed hyperlipidemia 02/16/2018    Past Surgical History:  Procedure Laterality Date   ABDOMINAL HYSTERECTOMY     TONSILLECTOMY      OB History   No obstetric history on file.      Home Medications    Prior to Admission medications   Medication Sig Start Date End Date Taking? Authorizing Provider  doxycycline (VIBRAMYCIN) 100 MG capsule Take 1 capsule (100 mg total) by mouth 2 (two) times daily. 02/15/21  Yes Scot Jun, FNP  predniSONE (DELTASONE) 20 MG tablet Take 1 tablet (20 mg total) by mouth daily with breakfast for 5 days. 02/15/21 02/20/21 Yes Scot Jun, FNP  estradiol (ESTRACE) 0.5 MG tablet TAKE 1 TABLET(0.5 MG) BY MOUTH DAILY 01/08/20   Ailene Ards, NP  FLUoxetine (PROZAC) 40 MG capsule Take 1 capsule (40 mg total) by mouth daily. 02/16/18   Caren Macadam, MD  loratadine-pseudoephedrine (CLARITIN-D 24-HOUR) 10-240 MG 24 hr tablet Take 1 tablet by  mouth daily.    [provider]  ondansetron (ZOFRAN) 4 MG tablet Take 1 tablet (4 mg total) by mouth every 6 (six) hours. 03/26/20   Wurst, [place], PA-C  progesterone (PROMETRIUM) 100 MG capsule TAKE 1 CAPSULE(100 MG) BY MOUTH DAILY 01/08/20   Ailene Ards, NP    Family History Family History  Problem Relation Age of Onset   Asthma Mother    COPD Mother    Hashimoto's thyroiditis Mother    Brain cancer Father    Thyroid cancer Sister    Hypothyroidism Sister    Graves' disease Sister    Goiter Brother    CAD Paternal Grandfather     Social History Social History   Tobacco Use   Smoking status: Every Day    Packs/day: 0.50    Years: 26.00    Pack years: 13.00    Types: Cigarettes   Smokeless tobacco: Never   Tobacco comments:    Chantix did not work-SI; welbutrin caused constipation  Vaping Use   Vaping Use: Never used  Substance Use Topics   Alcohol use: Yes    Comment: occ   Drug use: No     Allergies   Latex and Penicillins   Review of Systems Review of Systems Pertinent negatives listed in HPI   Physical Exam Triage Vital Signs ED Triage Vitals [02/15/21 1042]  Enc Vitals Group  BP 121/78     Pulse Rate 94     Resp 17     Temp 98.3 F (36.8 C)     Temp Source Oral     SpO2 97 %     Weight      Height      Head Circumference      Peak Flow      Pain Score 0     Pain Loc      Pain Edu?      Excl. in [place]?    No data found.  Updated Vital Signs BP 121/78 (BP Location: Right Arm)   Pulse 94   Temp 98.3 F (36.8 C) (Oral)   Resp 17   SpO2 97%   Visual Acuity Right Eye Distance:   Left Eye Distance:   Bilateral Distance:    Right Eye Near:   Left Eye Near:    Bilateral Near:     Physical Exam   General Appearance:    Alert, cooperative, no distress  HENT:   Normocephalic, ears normal, nares mucosal edema with congestion, rhinorrhea, oropharynx patent without exudate or erythema  Eyes:    PERRL, conjunctiva/corneas  clear, EOM's intact       Lungs:     Clear to auscultation bilaterally, respirations unlabored  Heart:    Regular rate and rhythm  Neurologic:   Awake, alert, oriented x 3. No apparent focal neurological           defect.     UC Treatments / Results  Labs (all labs ordered are listed, but only abnormal results are displayed) Labs Reviewed - No data to display  EKG   Radiology No results found.  Procedures Procedures (including critical care time)  Medications Ordered in UC Medications - No data to display  Initial Impression / Assessment and Plan / UC Course  I have reviewed the triage vital signs and the nursing notes.  Pertinent labs & imaging results that were available during my care of the patient were reviewed by me and considered in my medical decision making (see chart for details).     Patient has had multiple home negative COVID test and symptoms are zcute recurrent pansinusitis.  Treatment today with doxycycline and prednisone.  Highly consistent with that of just an ordinary sinus infection today.  Advised patient however if she develops any fever, generalized body aches sore throat or headache following today I would recommend repeat COVID testing as it is possible to acquire COVID-19 infection while being treated for an acute bacterial illness.  Patient verbalized understanding and agreement with today's plan.    Final Clinical Impressions(s) / UC Diagnoses   Final diagnoses:  Acute recurrent pansinusitis   Discharge Instructions   None    ED Prescriptions     Medication Sig Dispense Auth. Provider   doxycycline (VIBRAMYCIN) 100 MG capsule Take 1 capsule (100 mg total) by mouth 2 (two) times daily. 20 capsule Scot Jun, FNP   predniSONE (DELTASONE) 20 MG tablet Take 1 tablet (20 mg total) by mouth daily with breakfast for 5 days. 5 tablet Scot Jun, FNP      PDMP not reviewed this encounter.   Scot Jun, FNP 02/15/21  1138

## 2021-02-15 NOTE — ED Triage Notes (Addendum)
Pt thinks she has a sinus infection.  States she is having pressure in her sinus area, blowing out blood when she blows her nose.  Has had neg covid test.

## 2021-03-02 DIAGNOSIS — L821 Other seborrheic keratosis: Secondary | ICD-10-CM | POA: Diagnosis not present

## 2021-03-02 DIAGNOSIS — L648 Other androgenic alopecia: Secondary | ICD-10-CM | POA: Diagnosis not present

## 2021-04-24 DIAGNOSIS — R0981 Nasal congestion: Secondary | ICD-10-CM | POA: Diagnosis not present

## 2021-04-24 DIAGNOSIS — Z6830 Body mass index (BMI) 30.0-30.9, adult: Secondary | ICD-10-CM | POA: Diagnosis not present

## 2021-04-24 DIAGNOSIS — J0100 Acute maxillary sinusitis, unspecified: Secondary | ICD-10-CM | POA: Diagnosis not present

## 2021-07-13 DIAGNOSIS — J019 Acute sinusitis, unspecified: Secondary | ICD-10-CM | POA: Diagnosis not present

## 2021-07-13 DIAGNOSIS — U071 COVID-19: Secondary | ICD-10-CM | POA: Diagnosis not present

## 2021-07-13 DIAGNOSIS — J209 Acute bronchitis, unspecified: Secondary | ICD-10-CM | POA: Diagnosis not present

## 2021-07-27 DIAGNOSIS — J019 Acute sinusitis, unspecified: Secondary | ICD-10-CM | POA: Diagnosis not present

## 2021-07-27 DIAGNOSIS — J209 Acute bronchitis, unspecified: Secondary | ICD-10-CM | POA: Diagnosis not present

## 2021-12-25 DIAGNOSIS — Z9851 Tubal ligation status: Secondary | ICD-10-CM | POA: Diagnosis not present

## 2021-12-25 DIAGNOSIS — E876 Hypokalemia: Secondary | ICD-10-CM | POA: Diagnosis not present

## 2021-12-25 DIAGNOSIS — R0789 Other chest pain: Secondary | ICD-10-CM | POA: Diagnosis not present

## 2021-12-25 DIAGNOSIS — R110 Nausea: Secondary | ICD-10-CM | POA: Diagnosis not present

## 2021-12-25 DIAGNOSIS — R1011 Right upper quadrant pain: Secondary | ICD-10-CM | POA: Diagnosis not present

## 2021-12-25 DIAGNOSIS — R109 Unspecified abdominal pain: Secondary | ICD-10-CM | POA: Diagnosis not present

## 2021-12-25 DIAGNOSIS — I700 Atherosclerosis of aorta: Secondary | ICD-10-CM | POA: Diagnosis not present

## 2021-12-25 DIAGNOSIS — R197 Diarrhea, unspecified: Secondary | ICD-10-CM | POA: Diagnosis not present

## 2021-12-25 DIAGNOSIS — Z880 Allergy status to penicillin: Secondary | ICD-10-CM | POA: Diagnosis not present

## 2021-12-25 DIAGNOSIS — F17210 Nicotine dependence, cigarettes, uncomplicated: Secondary | ICD-10-CM | POA: Diagnosis not present

## 2021-12-25 DIAGNOSIS — R1010 Upper abdominal pain, unspecified: Secondary | ICD-10-CM | POA: Diagnosis not present

## 2021-12-25 DIAGNOSIS — Z90710 Acquired absence of both cervix and uterus: Secondary | ICD-10-CM | POA: Diagnosis not present

## 2021-12-29 DIAGNOSIS — E785 Hyperlipidemia, unspecified: Secondary | ICD-10-CM | POA: Diagnosis not present

## 2021-12-29 DIAGNOSIS — R197 Diarrhea, unspecified: Secondary | ICD-10-CM | POA: Diagnosis not present

## 2021-12-29 DIAGNOSIS — Z880 Allergy status to penicillin: Secondary | ICD-10-CM | POA: Diagnosis not present

## 2021-12-29 DIAGNOSIS — F17210 Nicotine dependence, cigarettes, uncomplicated: Secondary | ICD-10-CM | POA: Diagnosis not present

## 2021-12-31 DIAGNOSIS — K2950 Unspecified chronic gastritis without bleeding: Secondary | ICD-10-CM | POA: Diagnosis not present

## 2021-12-31 DIAGNOSIS — R6883 Chills (without fever): Secondary | ICD-10-CM | POA: Diagnosis not present

## 2022-01-27 DIAGNOSIS — R748 Abnormal levels of other serum enzymes: Secondary | ICD-10-CM | POA: Diagnosis not present

## 2022-01-27 DIAGNOSIS — R1013 Epigastric pain: Secondary | ICD-10-CM | POA: Diagnosis not present

## 2022-01-27 DIAGNOSIS — K219 Gastro-esophageal reflux disease without esophagitis: Secondary | ICD-10-CM | POA: Diagnosis not present

## 2022-01-27 DIAGNOSIS — R197 Diarrhea, unspecified: Secondary | ICD-10-CM | POA: Diagnosis not present

## 2022-01-27 DIAGNOSIS — E876 Hypokalemia: Secondary | ICD-10-CM | POA: Diagnosis not present

## 2022-01-28 DIAGNOSIS — A09 Infectious gastroenteritis and colitis, unspecified: Secondary | ICD-10-CM | POA: Diagnosis not present

## 2022-02-02 DIAGNOSIS — K529 Noninfective gastroenteritis and colitis, unspecified: Secondary | ICD-10-CM | POA: Diagnosis not present

## 2022-02-02 DIAGNOSIS — R198 Other specified symptoms and signs involving the digestive system and abdomen: Secondary | ICD-10-CM | POA: Diagnosis not present

## 2022-02-19 DIAGNOSIS — K5730 Diverticulosis of large intestine without perforation or abscess without bleeding: Secondary | ICD-10-CM | POA: Diagnosis not present

## 2022-02-19 DIAGNOSIS — K222 Esophageal obstruction: Secondary | ICD-10-CM | POA: Diagnosis not present

## 2022-02-19 DIAGNOSIS — D123 Benign neoplasm of transverse colon: Secondary | ICD-10-CM | POA: Diagnosis not present

## 2022-02-19 DIAGNOSIS — D125 Benign neoplasm of sigmoid colon: Secondary | ICD-10-CM | POA: Diagnosis not present

## 2022-02-19 DIAGNOSIS — R197 Diarrhea, unspecified: Secondary | ICD-10-CM | POA: Diagnosis not present

## 2022-02-19 DIAGNOSIS — K2930 Chronic superficial gastritis without bleeding: Secondary | ICD-10-CM | POA: Diagnosis not present

## 2022-02-19 DIAGNOSIS — K52831 Collagenous colitis: Secondary | ICD-10-CM | POA: Diagnosis not present

## 2022-02-19 DIAGNOSIS — R1084 Generalized abdominal pain: Secondary | ICD-10-CM | POA: Diagnosis not present

## 2022-02-19 DIAGNOSIS — D128 Benign neoplasm of rectum: Secondary | ICD-10-CM | POA: Diagnosis not present

## 2022-02-19 DIAGNOSIS — R110 Nausea: Secondary | ICD-10-CM | POA: Diagnosis not present

## 2022-02-19 DIAGNOSIS — K2970 Gastritis, unspecified, without bleeding: Secondary | ICD-10-CM | POA: Diagnosis not present

## 2022-02-19 DIAGNOSIS — K6289 Other specified diseases of anus and rectum: Secondary | ICD-10-CM | POA: Diagnosis not present

## 2022-02-21 DIAGNOSIS — R197 Diarrhea, unspecified: Secondary | ICD-10-CM | POA: Diagnosis not present

## 2022-03-19 DIAGNOSIS — Z23 Encounter for immunization: Secondary | ICD-10-CM | POA: Diagnosis not present

## 2022-03-19 DIAGNOSIS — R5383 Other fatigue: Secondary | ICD-10-CM | POA: Diagnosis not present

## 2022-03-19 DIAGNOSIS — F418 Other specified anxiety disorders: Secondary | ICD-10-CM | POA: Diagnosis not present

## 2022-03-19 DIAGNOSIS — Z79899 Other long term (current) drug therapy: Secondary | ICD-10-CM | POA: Diagnosis not present

## 2022-03-19 DIAGNOSIS — K51919 Ulcerative colitis, unspecified with unspecified complications: Secondary | ICD-10-CM | POA: Diagnosis not present

## 2022-03-19 DIAGNOSIS — R7301 Impaired fasting glucose: Secondary | ICD-10-CM | POA: Diagnosis not present

## 2022-03-23 DIAGNOSIS — Z86010 Personal history of colonic polyps: Secondary | ICD-10-CM | POA: Diagnosis not present

## 2022-03-23 DIAGNOSIS — K5289 Other specified noninfective gastroenteritis and colitis: Secondary | ICD-10-CM | POA: Diagnosis not present

## 2022-04-06 DIAGNOSIS — F430 Acute stress reaction: Secondary | ICD-10-CM | POA: Diagnosis not present

## 2022-05-06 DIAGNOSIS — F430 Acute stress reaction: Secondary | ICD-10-CM | POA: Diagnosis not present

## 2022-05-25 DIAGNOSIS — F418 Other specified anxiety disorders: Secondary | ICD-10-CM | POA: Diagnosis not present

## 2022-06-06 DIAGNOSIS — F430 Acute stress reaction: Secondary | ICD-10-CM | POA: Diagnosis not present

## 2022-07-07 DIAGNOSIS — F430 Acute stress reaction: Secondary | ICD-10-CM | POA: Diagnosis not present

## 2022-07-27 DIAGNOSIS — F418 Other specified anxiety disorders: Secondary | ICD-10-CM | POA: Diagnosis not present

## 2022-07-27 DIAGNOSIS — H6121 Impacted cerumen, right ear: Secondary | ICD-10-CM | POA: Diagnosis not present

## 2022-07-27 DIAGNOSIS — J329 Chronic sinusitis, unspecified: Secondary | ICD-10-CM | POA: Diagnosis not present

## 2022-08-05 DIAGNOSIS — F430 Acute stress reaction: Secondary | ICD-10-CM | POA: Diagnosis not present

## 2023-01-04 ENCOUNTER — Encounter: Payer: Self-pay | Admitting: Emergency Medicine

## 2023-01-04 ENCOUNTER — Ambulatory Visit
Admission: EM | Admit: 2023-01-04 | Discharge: 2023-01-04 | Disposition: A | Discharge status: Home / Self Care | Payer: BC Managed Care – PPO

## 2023-01-04 DIAGNOSIS — J0140 Acute pansinusitis, unspecified: Secondary | ICD-10-CM | POA: Diagnosis not present

## 2023-01-04 LAB — POCT RAPID STREP A (OFFICE): Rapid Strep A Screen: NEGATIVE

## 2023-01-04 MED ORDER — PSEUDOEPH-BROMPHEN-DM 30-2-10 MG/5ML PO SYRP: 5.0000 mL | ORAL_SOLUTION | Freq: Four times a day (QID) | ORAL | 0 refills | Status: DC | PRN

## 2023-01-04 MED ORDER — DOXYCYCLINE HYCLATE 100 MG PO TABS: 100.0000 mg | ORAL_TABLET | Freq: Two times a day (BID) | ORAL | 0 refills | Status: AC

## 2023-01-04 NOTE — ED Provider Notes (Signed)
[facility]    CSN: [id number] Arrival date & time: 01/04/23  1152      History   Chief Complaint No chief complaint on file.   HPI Briana Clark is a 52 y.o. female.   The history is provided by the patient.   The patient presents for complaints of intermittent fever, nasal congestion, headache, cough, and sore throat.  Patient symptoms started approximately 2 weeks ago, sore throat started 1 day ago.  She denies ear pain, ear drainage, wheezing, shortness of breath, difficulty breathing, chest pain, abdominal pain, nausea, vomiting, or diarrhea.  Patient reports a history of seasonal allergies.  She has been taking over-the-counter NyQuil, and her current allergy medication to include Claritin with no relief.  Past Medical History:  Diagnosis Date   Hemangioma    right side/UE and LE   Hyperlipidemia     Patient Active Problem List   Diagnosis Date Noted   Anxiety 02/16/2018   Tobacco abuse counseling 02/16/2018   Mixed hyperlipidemia 02/16/2018    Past Surgical History:  Procedure Laterality Date   ABDOMINAL HYSTERECTOMY     TONSILLECTOMY      OB History   No obstetric history on file.      Home Medications    Prior to Admission medications   Medication Sig Start Date End Date Taking? Authorizing Provider  brompheniramine-pseudoephedrine-DM 30-2-10 MG/5ML syrup Take 5 mLs by mouth 4 (four) times daily as needed. 01/04/23  Yes Kelsey Durflinger-Warren, Sadie Haber, NP  doxycycline (VIBRA-TABS) 100 MG tablet Take 1 tablet (100 mg total) by mouth 2 (two) times daily for 7 days. 01/04/23 01/11/23 Yes Chenita Ruda-Warren, Sadie Haber, NP  hydrOXYzine (ATARAX) 10 MG tablet Take 10 mg by mouth 3 (three) times daily as needed.   Yes [provider]  FLUoxetine (PROZAC) 40 MG capsule Take 1 capsule (40 mg total) by mouth daily. 02/16/18   Aliene Beams, MD  loratadine-pseudoephedrine (CLARITIN-D 24-HOUR) 10-240 MG 24 hr tablet Take 1 tablet by mouth daily.    [provider]  ondansetron (ZOFRAN) 4 MG tablet Take 1 tablet (4 mg total) by mouth every 6 (six) hours. 03/26/20   Rennis Harding, PA-C    Family History Family History  Problem Relation Age of Onset   Asthma Mother    COPD Mother    Hashimoto's thyroiditis Mother    Brain cancer Father    Thyroid cancer Sister    Hypothyroidism Sister    Graves' disease Sister    Goiter Brother    CAD Paternal Grandfather     Social History Social History   Tobacco Use   Smoking status: Every Day    Current packs/day: 0.50    Average packs/day: 0.5 packs/day for 26.0 years (13.0 ttl pk-yrs)    Types: Cigarettes   Smokeless tobacco: Never   Tobacco comments:    Chantix did not work-SI; welbutrin caused constipation  Vaping Use   Vaping status: Never Used  Substance Use Topics   Alcohol use: Yes    Comment: occ   Drug use: No     Allergies   Latex and Penicillins   Review of Systems Review of Systems Per HPI  Physical Exam Triage Vital Signs ED Triage Vitals  Encounter Vitals Group     BP 01/04/23 1221 101/73     Systolic BP Percentile --      Diastolic BP Percentile --      Pulse Rate 01/04/23 1221 (!) 119     Resp  01/04/23 1221 16     Temp 01/04/23 1221 98.4 F (36.9 C)     Temp Source 01/04/23 1221 Oral     SpO2 01/04/23 1221 94 %     Weight --      Height --      Head Circumference --      Peak Flow --      Pain Score 01/04/23 1223 4     Pain Loc --      Pain Education --      Exclude from Growth Chart --    No data found.  Updated Vital Signs BP 101/73 (BP Location: Right Arm)   Pulse (!) 119   Temp 98.4 F (36.9 C) (Oral)   Resp 16   SpO2 94%   Visual Acuity Right Eye Distance:   Left Eye Distance:   Bilateral Distance:    Right Eye Near:   Left Eye Near:    Bilateral Near:     Physical Exam Vitals and nursing note reviewed.  Constitutional:      General: She is not in acute distress.    Appearance: Normal appearance.  HENT:      Head: Normocephalic.     Right Ear: Tympanic membrane, ear canal and external ear normal.     Left Ear: Tympanic membrane, ear canal and external ear normal.     Nose: Congestion present. No rhinorrhea.     Right Turbinates: Enlarged and swollen.     Left Turbinates: Enlarged and swollen.     Right Sinus: Maxillary sinus tenderness and frontal sinus tenderness present.     Left Sinus: Maxillary sinus tenderness and frontal sinus tenderness present.     Mouth/Throat:     Lips: Pink.     Mouth: Mucous membranes are moist.     Pharynx: Oropharynx is clear. Uvula midline. Posterior oropharyngeal erythema and postnasal drip present. No pharyngeal swelling, oropharyngeal exudate or uvula swelling.     Comments: Cobblestoning present to posterior oropharynx Eyes:     Extraocular Movements: Extraocular movements intact.     Conjunctiva/sclera: Conjunctivae normal.     Pupils: Pupils are equal, round, and reactive to light.  Cardiovascular:     Rate and Rhythm: Regular rhythm. Tachycardia present.     Pulses: Normal pulses.     Heart sounds: Normal heart sounds.  Pulmonary:     Effort: Pulmonary effort is normal. No respiratory distress.     Breath sounds: Normal breath sounds. No stridor. No wheezing, rhonchi or rales.  Abdominal:     General: Bowel sounds are normal.     Palpations: Abdomen is soft.     Tenderness: There is no abdominal tenderness.  Musculoskeletal:     Cervical back: Normal range of motion.  Lymphadenopathy:     Cervical: No cervical adenopathy.  Skin:    General: Skin is warm and dry.  Neurological:     General: No focal deficit present.     Mental Status: She is alert and oriented to person, place, and time.  Psychiatric:        Mood and Affect: Mood normal.        Behavior: Behavior normal.      UC Treatments / Results  Labs (all labs ordered are listed, but only abnormal results are displayed) Labs Reviewed  POCT RAPID STREP A (OFFICE)     EKG   Radiology No results found.  Procedures Procedures (including critical care time)  Medications Ordered in UC Medications -  No data to display  Initial Impression / Assessment and Plan / UC Course  I have reviewed the triage vital signs and the nursing notes.  Pertinent labs & imaging results that were available during my care of the patient were reviewed by me and considered in my medical decision making (see chart for details).  The patient is well-appearing, she is in no acute distress, vital signs are stable.  Patient with symptoms that been present for the past 2 weeks, along with sinus tenderness and pressure.  Rapid strep test is negative, no culture ordered as patient's symptoms most likely aggravated by postnasal drainage..  Will treat empirically for acute pansinusitis with doxycycline 100 mg twice daily for the next 7 days.  Patient was also prescribed Bromfed-DM for her cough and to act as an antihistamine.  Patient advised to continue her current allergy regimen.  Supportive care recommendations were provided and discussed with the patient to include increasing fluids, allowing for plenty of rest, normal saline nasal spray as needed for nasal congestion, and warm salt water gargles.  Patient advised that if symptoms do not improve with this treatment, recommend that she follow-up in this clinic or with her primary care physician for further evaluation.  Patient is in agreement with this plan of care and verbalizes understanding.  All questions were answered.  Patient stable for discharge.  Final Clinical Impressions(s) / UC Diagnoses   Final diagnoses:  Acute pansinusitis, recurrence not specified     Discharge Instructions      The rapid strep test was negative. Take medication as directed. Continue your current allergy medication regimen. Increase fluids and get plenty of rest. May take over-the-counter ibuprofen or Tylenol as needed for pain, fever, or  general discomfort. Recommend normal saline nasal spray to help with nasal congestion throughout the day. Warm salt water gargles 3-4 times daily as needed for throat pain or discomfort. For your cough, it may be helpful to use a humidifier at bedtime during sleep. If symptoms fail to improve with this treatment, please follow-up with your primary care physician for further evaluation. Follow-up as needed.     ED Prescriptions     Medication Sig Dispense Auth. Provider   doxycycline (VIBRA-TABS) 100 MG tablet Take 1 tablet (100 mg total) by mouth 2 (two) times daily for 7 days. 14 tablet Viktoria Gruetzmacher-Warren, Sadie Haber, NP   brompheniramine-pseudoephedrine-DM 30-2-10 MG/5ML syrup Take 5 mLs by mouth 4 (four) times daily as needed. 140 mL Shimshon Narula-Warren, Sadie Haber, NP      PDMP not reviewed this encounter.   Abran Cantor, NP 01/04/23 1300

## 2023-01-04 NOTE — ED Triage Notes (Signed)
Nasal congestion x 2 weeks.  Sore throat since yesterday.  Has tried nyquil and allergy medication without relief.

## 2023-01-04 NOTE — Discharge Instructions (Addendum)
The rapid strep test was negative. Take medication as directed. Continue your current allergy medication regimen. Increase fluids and get plenty of rest. May take over-the-counter ibuprofen or Tylenol as needed for pain, fever, or general discomfort. Recommend normal saline nasal spray to help with nasal congestion throughout the day. Warm salt water gargles 3-4 times daily as needed for throat pain or discomfort. For your cough, it may be helpful to use a humidifier at bedtime during sleep. If symptoms fail to improve with this treatment, please follow-up with your primary care physician for further evaluation. Follow-up as needed.

## 2023-02-01 DIAGNOSIS — Z13220 Encounter for screening for lipoid disorders: Secondary | ICD-10-CM | POA: Diagnosis not present

## 2023-02-01 DIAGNOSIS — E538 Deficiency of other specified B group vitamins: Secondary | ICD-10-CM | POA: Diagnosis not present

## 2023-02-01 DIAGNOSIS — Z0000 Encounter for general adult medical examination without abnormal findings: Secondary | ICD-10-CM | POA: Diagnosis not present

## 2023-02-01 DIAGNOSIS — F418 Other specified anxiety disorders: Secondary | ICD-10-CM | POA: Diagnosis not present

## 2023-02-01 DIAGNOSIS — R7301 Impaired fasting glucose: Secondary | ICD-10-CM | POA: Diagnosis not present

## 2023-02-02 ENCOUNTER — Other Ambulatory Visit (HOSPITAL_COMMUNITY): Payer: Self-pay | Admitting: Family Medicine

## 2023-02-02 DIAGNOSIS — Z1231 Encounter for screening mammogram for malignant neoplasm of breast: Secondary | ICD-10-CM

## 2023-02-03 DIAGNOSIS — E538 Deficiency of other specified B group vitamins: Secondary | ICD-10-CM | POA: Diagnosis not present

## 2023-02-10 ENCOUNTER — Ambulatory Visit (HOSPITAL_COMMUNITY)
Admission: RE | Admit: 2023-02-10 | Discharge: 2023-02-10 | Disposition: A | Discharge status: Home / Self Care | Payer: BC Managed Care – PPO | Source: Ambulatory Visit | Attending: Family Medicine | Admitting: Family Medicine

## 2023-02-10 ENCOUNTER — Encounter (HOSPITAL_COMMUNITY): Payer: Self-pay

## 2023-02-10 DIAGNOSIS — Z1231 Encounter for screening mammogram for malignant neoplasm of breast: Secondary | ICD-10-CM | POA: Insufficient documentation

## 2023-02-10 DIAGNOSIS — E538 Deficiency of other specified B group vitamins: Secondary | ICD-10-CM | POA: Diagnosis not present

## 2023-02-17 DIAGNOSIS — E538 Deficiency of other specified B group vitamins: Secondary | ICD-10-CM | POA: Diagnosis not present

## 2023-02-28 DIAGNOSIS — E538 Deficiency of other specified B group vitamins: Secondary | ICD-10-CM | POA: Diagnosis not present

## 2023-03-07 ENCOUNTER — Ambulatory Visit
Admission: EM | Admit: 2023-03-07 | Discharge: 2023-03-07 | Disposition: A | Discharge status: Home / Self Care | Payer: BC Managed Care – PPO | Attending: Nurse Practitioner | Admitting: Nurse Practitioner

## 2023-03-07 DIAGNOSIS — R110 Nausea: Secondary | ICD-10-CM | POA: Diagnosis not present

## 2023-03-07 DIAGNOSIS — G43811 Other migraine, intractable, with status migrainosus: Secondary | ICD-10-CM | POA: Diagnosis not present

## 2023-03-07 DIAGNOSIS — F4322 Adjustment disorder with anxiety: Secondary | ICD-10-CM | POA: Diagnosis not present

## 2023-03-07 MED ORDER — ONDANSETRON HCL 4 MG/2ML IJ SOLN
4.0000 mg | Freq: Once | INTRAMUSCULAR | Status: AC
  Administered 2023-03-07: 4 mg via INTRAMUSCULAR

## 2023-03-07 MED ORDER — KETOROLAC TROMETHAMINE 30 MG/ML IJ SOLN
30.0000 mg | Freq: Once | INTRAMUSCULAR | Status: AC
  Administered 2023-03-07: 30 mg via INTRAMUSCULAR

## 2023-03-07 MED ORDER — DEXAMETHASONE SODIUM PHOSPHATE 10 MG/ML IJ SOLN
10.0000 mg | Freq: Once | INTRAMUSCULAR | Status: AC
  Administered 2023-03-07: 10 mg via INTRAMUSCULAR
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
## 2023-03-07 NOTE — Discharge Instructions (Addendum)
We gave you medicine for the migraine today including Zofran, Toradol, and Decadron.  Do not take any other NSAIDs for the next 48 hours.  Recommend follow-up with PCP for recurrent migraines.
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
## 2023-03-15 ENCOUNTER — Encounter: Payer: Self-pay | Admitting: Obstetrics & Gynecology

## 2023-03-15 ENCOUNTER — Ambulatory Visit (INDEPENDENT_AMBULATORY_CARE_PROVIDER_SITE_OTHER): Payer: BC Managed Care – PPO | Admitting: Obstetrics & Gynecology

## 2023-03-15 VITALS — BP 104/74 | HR 98 | Ht 62.0 in | Wt 172.0 lb

## 2023-03-15 DIAGNOSIS — Z01419 Encounter for gynecological examination (general) (routine) without abnormal findings: Secondary | ICD-10-CM

## 2023-03-15 DIAGNOSIS — N951 Menopausal and female climacteric states: Secondary | ICD-10-CM | POA: Diagnosis not present

## 2023-03-15 MED ORDER — VEOZAH 45 MG PO TABS: 1.0000 | ORAL_TABLET | Freq: Every day | ORAL | 11 refills | Status: AC

## 2023-03-15 NOTE — Progress Notes (Signed)
Subjective:     Briana Clark is a 52 y.o. female here for a routine exam.  No LMP recorded. Patient has had a hysterectomy. [id number] Birth Control Method:  hysterectomy many years ago at [facility]) Menstrual Calendar(currently): na  Current complaints: none.   Current acute medical issues:  smoker + vasomotor symptoms 2 per day + 2-3 night sweats per week Recent Gynecologic History No LMP recorded. Patient has had a hysterectomy. Last Pap: unsure and n/a,   Last mammogram: 02/2023,  normal  Past Medical History:  Diagnosis Date   Hemangioma    right side/UE and LE   Hyperlipidemia     Past Surgical History:  Procedure Laterality Date   ABDOMINAL HYSTERECTOMY     TONSILLECTOMY      OB History     Gravida  2   Para  2   Term  2   Preterm      AB      Living  2      SAB      IAB      Ectopic      Multiple      Live Births              Social History   Socioeconomic History   Marital status: Married    Spouse name: Not on file   Number of children: Not on file   Years of education: Not on file   Highest education level: Not on file  Occupational History   Not on file  Tobacco Use   Smoking status: Every Day    Current packs/day: 0.50    Average packs/day: 0.5 packs/day for 26.0 years (13.0 ttl pk-yrs)    Types: Cigarettes   Smokeless tobacco: Never   Tobacco comments:    Chantix did not work-SI; welbutrin caused constipation  Vaping Use   Vaping status: Never Used  Substance and Sexual Activity   Alcohol use: Yes    Comment: occ   Drug use: No   Sexual activity: Yes    Birth control/protection: Surgical  Other Topics Concern   Not on file  Social History Narrative   Lives in [place], [place].   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    Dogs, walks them regularly.    Eats all foods.   Wear seatbelt.   Enjoys reading and make wreaths.    Social Determinants of Health    Financial Resource Strain: Low Risk  (03/15/2023)   Overall Financial Resource Strain (CARDIA)    Difficulty of Paying Living Expenses: Not hard at all  Food Insecurity: No Food Insecurity (03/15/2023)   Hunger Vital Sign    Worried About Running Out of Food in the Last Year: Never true    Ran Out of Food in the Last Year: Never true  Transportation Needs: No Transportation Needs (03/15/2023)   PRAPARE - Administrator, Civil Service (Medical): No    Lack of Transportation (Non-Medical): No  Physical Activity: Insufficiently Active (03/15/2023)   Exercise Vital Sign    Days of Exercise per Week: 2 days    Minutes of Exercise per Session: 20 min  Stress: Stress Concern Present (03/15/2023)   [employer] of Occupational Health - Occupational Stress Questionnaire    Feeling of Stress : Very much  Social Connections: Moderately Integrated (03/15/2023)   Social Connection and Isolation Panel [NHANES]    Frequency of Communication with Friends and  Family: Once a week    Frequency of Social Gatherings with Friends and Family: Once a week    Attends Religious Services: More than 4 times per year    Active Member of [employer] or Organizations: Yes    Attends Engineer, structural: More than 4 times per year    Marital Status: Married    Family History  Problem Relation Age of Onset   Asthma Mother    COPD Mother    Hashimoto's thyroiditis Mother    Brain cancer Father    Thyroid cancer Sister    Hypothyroidism Sister    Graves' disease Sister    Goiter Brother    CAD Paternal Grandfather      Current Outpatient Medications:    Fezolinetant (VEOZAH) 45 MG TABS, Take 1 tablet (45 mg total) by mouth at bedtime., Disp: 30 tablet, Rfl: 11   FLUoxetine (PROZAC) 40 MG capsule, Take 1 capsule (40 mg total) by mouth daily., Disp: 90 capsule, Rfl: 0   hydrOXYzine (ATARAX) 10 MG tablet, Take 10 mg by mouth 3 (three) times daily as needed., Disp: , Rfl:     loratadine-pseudoephedrine (CLARITIN-D 24-HOUR) 10-240 MG 24 hr tablet, Take 1 tablet by mouth daily., Disp: , Rfl:   Current Facility-Administered Medications:    NONFORMULARY OR COMPOUNDED ITEM 0.2 mL, 0.2 mL, Intramuscular, Once, Gosrani, Nimish C, MD  Review of Systems  Review of Systems  Constitutional: Negative for fever, chills, weight loss, malaise/fatigue and diaphoresis.  HENT: Negative for hearing loss, ear pain, nosebleeds, congestion, sore throat, neck pain, tinnitus and ear discharge.   Eyes: Negative for blurred vision, double vision, photophobia, pain, discharge and redness.  Respiratory: Negative for cough, hemoptysis, sputum production, shortness of breath, wheezing and stridor.   Cardiovascular: Negative for chest pain, palpitations, orthopnea, claudication, leg swelling and PND.  Gastrointestinal: negative for abdominal pain. Negative for heartburn, nausea, vomiting, diarrhea, constipation, blood in stool and melena.  Genitourinary: Negative for dysuria, urgency, frequency, hematuria and flank pain.  Musculoskeletal: Negative for myalgias, back pain, joint pain and falls.  Skin: Negative for itching and rash.  Neurological: Negative for dizziness, tingling, tremors, sensory change, speech change, focal weakness, seizures, loss of consciousness, weakness and headaches.  Endo/Heme/Allergies: Negative for environmental allergies and polydipsia. Does not bruise/bleed easily.  Psychiatric/Behavioral: Negative for depression, suicidal ideas, hallucinations, memory loss and substance abuse. The patient is not nervous/anxious and does not have insomnia.        Objective:  Blood pressure 104/74, pulse 98, height 5\' 2"  (1.575 m), weight 172 lb (78 kg).   Physical Exam  Vitals reviewed. Constitutional: She is oriented to person, place, and time. She appears well-developed and well-nourished.  HENT:  Head: Normocephalic and atraumatic.        Right Ear: External ear normal.   Left Ear: External ear normal.  Nose: Nose normal.  Mouth/Throat: Oropharynx is clear and moist.  Eyes: Conjunctivae and EOM are normal. Pupils are equal, round, and reactive to light. Right eye exhibits no discharge. Left eye exhibits no discharge. No scleral icterus.  Neck: Normal range of motion. Neck supple. No tracheal deviation present. No thyromegaly present.  Cardiovascular: Normal rate, regular rhythm, normal heart sounds and intact distal pulses.  Exam reveals no gallop and no friction rub.   No murmur heard. Respiratory: Effort normal and breath sounds normal. No respiratory distress. She has no wheezes. She has no rales. She exhibits no tenderness.  GI: Soft. Bowel sounds are normal. She exhibits  no distension and no mass. There is no tenderness. There is no rebound and no guarding.  Genitourinary:  Breasts no masses skin changes or nipple changes bilaterally      Vulva is normal without lesions Vagina is pink moist without discharge Cervix absent Pap not done Uterus is absent Adnexa is negative w  Musculoskeletal: Normal range of motion. She exhibits no edema and no tenderness.  Neurological: She is alert and oriented to person, place, and time. She has normal reflexes. She displays normal reflexes. No cranial nerve deficit. She exhibits normal muscle tone. Coordination normal.  Skin: Skin is warm and dry. No rash noted. No erythema. No pallor.  Psychiatric: She has a normal mood and affect. Her behavior is normal. Judgment and thought content normal.       Medications Ordered at today's visit: Meds ordered this encounter  Medications   Fezolinetant (VEOZAH) 45 MG TABS    Sig: Take 1 tablet (45 mg total) by mouth at bedtime.    Dispense:  30 tablet    Refill:  11    Other orders placed at today's visit: No orders of the defined types were placed in this encounter.     Assessment:    Normal Gyn exam.   S/P hysterectomy including cervix    ICD-10-CM   1. Well  woman exam with routine gynecological exam  Z01.419     2. Vasomotor symptoms due to menopause  N95.1    Trial of veozah, LFTs 12/2021 were normal if approved will recheck at 1/3/6 months      Plan:    Contraception: status post hysterectomy. Follow up in: 3 years. Yearly mammogram recommended      No follow-ups on file.

## 2023-03-18 ENCOUNTER — Ambulatory Visit
Admission: EM | Admit: 2023-03-18 | Discharge: 2023-03-18 | Disposition: A | Discharge status: Home / Self Care | Payer: BC Managed Care – PPO | Attending: Nurse Practitioner | Admitting: Nurse Practitioner

## 2023-03-18 DIAGNOSIS — G43801 Other migraine, not intractable, with status migrainosus: Secondary | ICD-10-CM | POA: Diagnosis not present

## 2023-03-18 MED ORDER — KETOROLAC TROMETHAMINE 30 MG/ML IJ SOLN
30.0000 mg | Freq: Once | INTRAMUSCULAR | Status: AC
  Administered 2023-03-18: 30 mg via INTRAMUSCULAR

## 2023-03-18 MED ORDER — ONDANSETRON 4 MG PO TBDP
4.0000 mg | ORAL_TABLET | Freq: Once | ORAL | Status: AC
  Administered 2023-03-18: 4 mg via ORAL

## 2023-03-18 MED ORDER — DEXAMETHASONE SODIUM PHOSPHATE 10 MG/ML IJ SOLN
10.0000 mg | INTRAMUSCULAR | Status: AC
  Administered 2023-03-18: 10 mg via INTRAMUSCULAR

## 2023-03-18 NOTE — ED Triage Notes (Signed)
Pt reports after a coworker lit  a candle at work she began having migraine issues x 1 day

## 2023-03-18 NOTE — Discharge Instructions (Signed)
You were given Toradol 30 mg, dexamethasone 10 mg, and Zofran 4 mg today.  Do not take any additional ibuprofen, Advil, or Aleve.  You can take over-the-counter Tylenol as needed for breakthrough pain. Increase fluids and allow for plenty of rest. Recommend sleeping in a dimly lit room, and away from loud noises to prevent worsening of your symptoms. Go to the emergency department immediately if you experience worsening headache pain, visual changes, slurred speech, change in your mental status or other concerns. As discussed, please follow-up with your primary care physician to discuss medications to treat increasing frequency of migraines. Follow-up as needed.

## 2023-03-18 NOTE — ED Provider Notes (Signed)
[facility]    CSN: [id number] Arrival date & time: 03/18/23  1127      History   Chief Complaint No chief complaint on file.   HPI Briana Clark is a 52 y.o. female.   The history is provided by the patient.   Patient presents today for 1-day history of migraine. Reports symptoms started while at work when somebody lit a candle. Reports she has history of migraines that are sensitive to smells. She endorses spots of blurred vision at onset of migraine have not improved. She still has photophobia and nausea. No lightheadedness or dizziness, chest pain, shortness of breath. No blurred or double vision, trouble swallowing, trouble with speech, or recent head injury. No upper or lower extremity weakness or recent fall. Has taken over-the-counter Tylenol, ibuprofen, and Excedrin without improvement. Migraine is currently an 8 out of 10 on the pain scale, is not the worst headache of her life.  Patient was seen for the same or similar symptoms approximately 2 weeks ago.  She reports good improvement of her symptoms with medication provided at that time.  Past Medical History:  Diagnosis Date   Hemangioma    right side/UE and LE   Hyperlipidemia     Patient Active Problem List   Diagnosis Date Noted   Anxiety 02/16/2018   Tobacco abuse counseling 02/16/2018   Mixed hyperlipidemia 02/16/2018    Past Surgical History:  Procedure Laterality Date   ABDOMINAL HYSTERECTOMY     TONSILLECTOMY      OB History     Gravida  2   Para  2   Term  2   Preterm      AB      Living  2      SAB      IAB      Ectopic      Multiple      Live Births               Home Medications    Prior to Admission medications   Medication Sig Start Date End Date Taking? Authorizing Provider  Fezolinetant (VEOZAH) 45 MG TABS Take 1 tablet (45 mg total) by mouth at bedtime. 03/15/23   Lazaro Arms, MD  FLUoxetine (PROZAC) 40 MG capsule Take 1 capsule (40 mg total) by  mouth daily. 02/16/18   Aliene Beams, MD  hydrOXYzine (ATARAX) 10 MG tablet Take 10 mg by mouth 3 (three) times daily as needed.    [provider]  loratadine-pseudoephedrine (CLARITIN-D 24-HOUR) 10-240 MG 24 hr tablet Take 1 tablet by mouth daily.    [provider]    Family History Family History  Problem Relation Age of Onset   Asthma Mother    COPD Mother    Hashimoto's thyroiditis Mother    Brain cancer Father    Thyroid cancer Sister    Hypothyroidism Sister    Luiz Blare' disease Sister    Goiter Brother    CAD Paternal Grandfather     Social History Social History   Tobacco Use   Smoking status: Every Day    Current packs/day: 0.50    Average packs/day: 0.5 packs/day for 26.0 years (13.0 ttl pk-yrs)    Types: Cigarettes   Smokeless tobacco: Never   Tobacco comments:    Chantix did not work-SI; welbutrin caused constipation  Vaping Use   Vaping status: Never Used  Substance Use Topics   Alcohol use: Yes    Comment: occ  Drug use: No     Allergies   Latex and Penicillins   Review of Systems Review of Systems Per HPI  Physical Exam Triage Vital Signs ED Triage Vitals  Encounter Vitals Group     BP 03/18/23 1311 94/64     Systolic BP Percentile --      Diastolic BP Percentile --      Pulse Rate 03/18/23 1311 97     Resp 03/18/23 1311 16     Temp 03/18/23 1311 98.3 F (36.8 C)     Temp Source 03/18/23 1311 Oral     SpO2 03/18/23 1311 95 %     Weight --      Height --      Head Circumference --      Peak Flow --      Pain Score 03/18/23 1312 6     Pain Loc --      Pain Education --      Exclude from Growth Chart --    No data found.  Updated Vital Signs BP 94/64 (BP Location: Right Arm)   Pulse 97   Temp 98.3 F (36.8 C) (Oral)   Resp 16   SpO2 95%   Visual Acuity Right Eye Distance:   Left Eye Distance:   Bilateral Distance:    Right Eye Near:   Left Eye Near:    Bilateral Near:     Physical Exam Vitals and  nursing note reviewed.  Constitutional:      General: She is not in acute distress.    Appearance: Normal appearance.  HENT:     Head: Normocephalic.     Right Ear: Tympanic membrane, ear canal and external ear normal.     Left Ear: Tympanic membrane, ear canal and external ear normal.     Mouth/Throat:     Mouth: Mucous membranes are moist.  Eyes:     Extraocular Movements: Extraocular movements intact.     Conjunctiva/sclera: Conjunctivae normal.     Pupils: Pupils are equal, round, and reactive to light.  Cardiovascular:     Rate and Rhythm: Normal rate and regular rhythm.     Pulses: Normal pulses.     Heart sounds: Normal heart sounds.  Pulmonary:     Effort: Pulmonary effort is normal.     Breath sounds: Normal breath sounds.  Abdominal:     General: Bowel sounds are normal.     Palpations: Abdomen is soft.     Tenderness: There is no abdominal tenderness.  Skin:    General: Skin is warm and dry.  Neurological:     General: No focal deficit present.     Mental Status: She is alert and oriented to person, place, and time.     GCS: GCS eye subscore is 4. GCS verbal subscore is 5. GCS motor subscore is 6.     Cranial Nerves: Cranial nerves 2-12 are intact.     Sensory: Sensation is intact.     Motor: Motor function is intact.     Coordination: Coordination is intact.     Gait: Gait is intact.  Psychiatric:        Mood and Affect: Mood normal.        Behavior: Behavior normal.      UC Treatments / Results  Labs (all labs ordered are listed, but only abnormal results are displayed) Labs Reviewed - No data to display  EKG   Radiology No results found.  Procedures Procedures (including critical  care time)  Medications Ordered in UC Medications  ketorolac (TORADOL) 30 MG/ML injection 30 mg (has no administration in time range)  dexamethasone (DECADRON) injection 10 mg (has no administration in time range)  ondansetron (ZOFRAN-ODT) disintegrating tablet 4 mg  (has no administration in time range)    Initial Impression / Assessment and Plan / UC Course  I have reviewed the triage vital signs and the nursing notes.  Pertinent labs & imaging results that were available during my care of the patient were reviewed by me and considered in my medical decision making (see chart for details).  Patient is well-appearing, she is in no acute distress, BP is soft, but vital signs are otherwise stable.  Toradol 30 mg IM, dexamethasone 10 mg IM, and Zofran 4 mg ODT administered for migraine.  Supportive care recommendations were provided and discussed with the patient to include increasing fluids, allowing for plenty of rest, sleeping in a dimly lit room, and applying cool cloth to her head.  Patient was given strict ER follow-up precautions.  Patient was also advised to follow-up with her PCP to discuss treatment for increasingly frequent migraines.  Patient is in agreement with this plan of care and verbalizes understanding.  All questions were answered.  Patient stable for discharge.  Work note was provided.  Final Clinical Impressions(s) / UC Diagnoses   Final diagnoses:  Other migraine with status migrainosus, not intractable     Discharge Instructions      You were given Toradol 30 mg, dexamethasone 10 mg, and Zofran 4 mg today.  Do not take any additional ibuprofen, Advil, or Aleve.  You can take over-the-counter Tylenol as needed for breakthrough pain. Increase fluids and allow for plenty of rest. Recommend sleeping in a dimly lit room, and away from loud noises to prevent worsening of your symptoms. Go to the emergency department immediately if you experience worsening headache pain, visual changes, slurred speech, change in your mental status or other concerns. As discussed, please follow-up with your primary care physician to discuss medications to treat increasing frequency of migraines. Follow-up as needed.      ED Prescriptions   None     PDMP not reviewed this encounter.   Abran Cantor, NP 03/18/23 1620

## 2023-04-07 DIAGNOSIS — F4322 Adjustment disorder with anxiety: Secondary | ICD-10-CM | POA: Diagnosis not present

## 2023-05-04 ENCOUNTER — Encounter (HOSPITAL_BASED_OUTPATIENT_CLINIC_OR_DEPARTMENT_OTHER): Payer: Self-pay | Admitting: Emergency Medicine

## 2023-05-04 ENCOUNTER — Emergency Department (HOSPITAL_BASED_OUTPATIENT_CLINIC_OR_DEPARTMENT_OTHER): Payer: BC Managed Care – PPO | Admitting: Radiology

## 2023-05-04 ENCOUNTER — Emergency Department (HOSPITAL_BASED_OUTPATIENT_CLINIC_OR_DEPARTMENT_OTHER)
Admission: EM | Admit: 2023-05-04 | Discharge: 2023-05-04 | Disposition: A | Discharge status: Home / Self Care | Payer: BC Managed Care – PPO | Attending: Emergency Medicine | Admitting: Emergency Medicine

## 2023-05-04 ENCOUNTER — Other Ambulatory Visit: Payer: Self-pay

## 2023-05-04 DIAGNOSIS — S52501A Unspecified fracture of the lower end of right radius, initial encounter for closed fracture: Secondary | ICD-10-CM | POA: Diagnosis not present

## 2023-05-04 DIAGNOSIS — W1789XA Other fall from one level to another, initial encounter: Secondary | ICD-10-CM | POA: Insufficient documentation

## 2023-05-04 DIAGNOSIS — S52571A Other intraarticular fracture of lower end of right radius, initial encounter for closed fracture: Secondary | ICD-10-CM | POA: Insufficient documentation

## 2023-05-04 DIAGNOSIS — S6991XA Unspecified injury of right wrist, hand and finger(s), initial encounter: Secondary | ICD-10-CM | POA: Diagnosis not present

## 2023-05-04 DIAGNOSIS — Z91040 Latex allergy status: Secondary | ICD-10-CM | POA: Diagnosis not present

## 2023-05-04 MED ORDER — OXYCODONE-ACETAMINOPHEN 5-325 MG PO TABS: 1.0000 | ORAL_TABLET | Freq: Four times a day (QID) | ORAL | 0 refills | Status: DC | PRN

## 2023-05-04 MED ORDER — OXYCODONE-ACETAMINOPHEN 5-325 MG PO TABS
1.0000 | ORAL_TABLET | Freq: Once | ORAL | Status: AC
  Administered 2023-05-04: 1 via ORAL
  Filled 2023-05-04: qty 1

## 2023-05-04 NOTE — ED Provider Notes (Signed)
[place] [facility] AT [facility] Provider Note   CSN: [id number] Arrival date & time: 05/04/23  1922     History Chief Complaint  Patient presents with   Arm Injury    Briana Clark is a 52 y.o. female with history of anxiety, depression, hyperlipidemia presents emerged from today for evaluation of right wrist pain after fall.  Patient reports that she was jumping off a bed of a truck while it was parked and landed catching herself with her right hand.  She denies hitting her head or any other injury.  She was able to catch herself with her feet afterwards.  She is not on any blood thinner use.  Denies any other injury to her chest, belly, back, lower extremities, head, or neck.  She currently denies any other pain other than some pain in her right wrist.  She does have a meningioma to the area but reports coloration is at its chronic state.   Arm Injury Associated symptoms: no back pain, no fever and no neck pain        Home Medications Prior to Admission medications   Medication Sig Start Date End Date Taking? Authorizing Provider  Fezolinetant (VEOZAH) 45 MG TABS Take 1 tablet (45 mg total) by mouth at bedtime. 03/15/23   Lazaro Arms, MD  FLUoxetine (PROZAC) 40 MG capsule Take 1 capsule (40 mg total) by mouth daily. 02/16/18   Aliene Beams, MD  hydrOXYzine (ATARAX) 10 MG tablet Take 10 mg by mouth 3 (three) times daily as needed.    [provider]  loratadine-pseudoephedrine (CLARITIN-D 24-HOUR) 10-240 MG 24 hr tablet Take 1 tablet by mouth daily.    [provider]      Allergies    Latex and Penicillins    Review of Systems   Review of Systems  Constitutional:  Negative for fever.  Respiratory:  Negative for shortness of breath.   Cardiovascular:  Negative for chest pain.  Musculoskeletal:  Positive for arthralgias. Negative for back pain, myalgias, neck pain and neck stiffness.  Neurological:  Negative for syncope and  headaches.    Physical Exam Updated Vital Signs BP 125/80   Pulse 85   Temp 97.9 F (36.6 C) (Oral)   Resp 16   Wt 78 kg   SpO2 96%   BMI 31.46 kg/m  Physical Exam Vitals and nursing note reviewed.  Constitutional:      General: She is not in acute distress.    Appearance: She is not ill-appearing or toxic-appearing.  HENT:     Head: Normocephalic and atraumatic.  Eyes:     General: No scleral icterus. Cardiovascular:     Rate and Rhythm: Normal rate.     Pulses: Normal pulses.  Pulmonary:     Effort: Pulmonary effort is normal. No respiratory distress.  Abdominal:     Tenderness: There is no abdominal tenderness. There is no guarding or rebound.  Musculoskeletal:        General: Tenderness present.     Comments: No tenderness to the midline or paraspinal cervical or thoracic, lumbar spine.  No tenderness to the bilateral lower extremities.  No tenderness to the left upper extremity.  For the right upper extremity, patient does not have any pain upon movement of the shoulder or elbow.  No tenderness palpation to these areas as well.  Does have some snuffbox tenderness palpation has some pain with flexion extension of the wrist.  Brisk cap refill present in all  10 fingers.  Palpable radial pulses that are symmetric as well.  Compartments soft throughout.  Sensation reportedly intact and symmetric.  Patient does have a hemangioma but reports coloration is at its baseline.  Has some bruising seen to the more proximal aspect of the palm but no lacerations or abrasions appreciated.  Skin:    General: Skin is warm and dry.  Neurological:     General: No focal deficit present.     Mental Status: She is alert.     ED Results / Procedures / Treatments   Labs (all labs ordered are listed, but only abnormal results are displayed) Labs Reviewed - No data to display  EKG None  Radiology DG Hand Complete Right  Result Date: 05/04/2023 CLINICAL DATA:  Status post trauma. EXAM:  RIGHT HAND - COMPLETE 3+ VIEW COMPARISON:  None Available. FINDINGS: There is no evidence of fracture or dislocation. There is no evidence of arthropathy or other focal bone abnormality. Soft tissues are unremarkable. IMPRESSION: Negative. Electronically Signed   By: Aram Candela M.D.   On: 05/04/2023 20:36   DG Wrist Complete Right  Result Date: 05/04/2023 CLINICAL DATA:  Status post trauma. EXAM: RIGHT WRIST - COMPLETE 3+ VIEW COMPARISON:  None Available. FINDINGS: There is an acute, nondisplaced fracture deformity involving the distal right radius, with extension to involve the radiocarpal joint. There is no evidence of dislocation. There is no evidence of arthropathy or other focal bone abnormality. Mild diffuse soft tissue swelling is seen. IMPRESSION: Acute fracture of the distal right radius. Electronically Signed   By: Aram Candela M.D.   On: 05/04/2023 20:34    Procedures Procedures   Medications Ordered in ED Medications  oxyCODONE-acetaminophen (PERCOCET/ROXICET) 5-325 MG per tablet 1 tablet (1 tablet Oral Given 05/04/23 2051)    ED Course/ Medical Decision Making/ A&P                                Medical Decision Making Amount and/or Complexity of Data Reviewed Radiology: ordered.  Risk Prescription drug management.   52 y.o. female presents to the ER for evaluation of arm pain. Differential diagnosis includes but is not limited to trauma, fracture, dislocation, sprain, strain, tendon/ligament damage. Vital signs unremarkable. Physical exam as noted above.   The patient reports that she had a fall on outstretched hand.  She denies hitting or injuring any other part of her body.  She denies any pain to the elbow or shoulder.  No pain in, palpation or upon range of motion to these areas as well.  She does have some snuff box tenderness to palpation.  She does have some tenderness into the more proximal aspect of the palm.  No lacerations or abrasions seen however  there is some bruising.  Will order hand x-ray as well as wrist.  XR imaging shows acute fracture of the distal right radius. Fracture is intraarticular.   Consulted Dr. Izora Ribas placed. Recommends splint and follow up with hand outpatient.   Given the snuffbox tenderness as well, although likely from a radial fracture, will place patient in a thumb spica as well has a volar.  The patient is NV intact distally pre and post splint. She reports improvement of pain with the splint.  She does not have any tenderness to the elbow, humerus, or shoulder.  Does not have any pain with rotation of the shoulder or flexion or extension of the elbow.  Compartments  are soft.  She denies hitting her head or any other injury.  She does not have any back or belly or chest tenderness.  I do not think any other imaging is needed at this time.  Information for hand surgery placed as well as a prescription for narcotics given.  We discussed pain management as well as splint care.  Discussed the importance of following up with hand surgery.  She is stable for discharge home with close outpatient follow-up with hand.  We discussed the results of the labs/imaging. The plan is splint care, pain management, follow up with hand. We discussed strict return precautions and red flag symptoms. The patient verbalized their understanding and agrees to the plan. The patient is stable and being discharged home in good condition.  Portions of this report may have been transcribed using voice recognition software. Every effort was made to ensure accuracy; however, inadvertent computerized transcription errors may be present.   Final Clinical Impression(s) / ED Diagnoses Final diagnoses:  Other closed intra-articular fracture of distal end of right radius, initial encounter    Rx / DC Orders ED Discharge Orders          Ordered    oxyCODONE-acetaminophen (PERCOCET/ROXICET) 5-325 MG tablet  Every 6 hours PRN        05/04/23 2251               Achille Rich, PA-C 05/08/23 0226    Royanne Foots, DO 05/14/23 1055

## 2023-05-04 NOTE — ED Triage Notes (Signed)
Patient fell off the back of a pick up truck while assisting someone else and caught herself on her right arm.  Patient c/o right wrist and hand pain, right hand dominant.

## 2023-05-04 NOTE — Discharge Instructions (Addendum)
You were seen in the ER for evaluation after your fall. It appears that you have a fracture. We have placed you in a splint. I have included information on splint care into the discharge paperwork. Please review. For pain, I recommend 650mg  of Tylenol and or 600mg  of ibuprofen every 6 hours as needed for pain. I am also going to prescribe you a stronger narcotic pain medication to take for breakthrough pain. Please do not drive or operate heavy machinery while on this medication. It is important that you call Dr. Debby Bud office to schedule an appointment ASAP. If you have any concerns, new or worsening symptoms, please return to the nearest ER for re-evaluation.   Contact a doctor if: The skin around the cast or splint gets red or raw. The skin under the cast is very itchy or painful. Your cast or splint: Gets damaged. Feels very uncomfortable. Is too tight or too loose. Your cast becomes wet or it starts to have a soft spot or area. There is a bad smell coming from under your cast. You get an object stuck under your cast. Get help right away if: You get any symptoms of compartment syndrome, such as: Very bad pain or pressure under the cast. Numbness, tingling, coldness, or pale or bluish skin. The part of your body above or below the cast is swollen, and it turns a different color (is discolored). You cannot feel or move your fingers or toes. Your pain gets worse. There is fluid leaking through the cast. You have trouble breathing or shortness of breath. You have chest pain. These symptoms may be an emergency. Get help right away. Call your local emergency services (911 in the [place] Do not wait to see if the symptoms will go away. Do not drive yourself to the hospital.

## 2023-05-04 NOTE — ED Notes (Signed)
 RN reviewed discharge instructions with pt. Pt verbalized understanding and had no further questions. VSS upon discharge.  

## 2023-05-07 DIAGNOSIS — F4322 Adjustment disorder with anxiety: Secondary | ICD-10-CM | POA: Diagnosis not present

## 2023-05-10 DIAGNOSIS — F4322 Adjustment disorder with anxiety: Secondary | ICD-10-CM | POA: Diagnosis not present

## 2023-05-10 DIAGNOSIS — E7800 Pure hypercholesterolemia, unspecified: Secondary | ICD-10-CM | POA: Diagnosis not present

## 2023-05-10 DIAGNOSIS — F418 Other specified anxiety disorders: Secondary | ICD-10-CM | POA: Diagnosis not present

## 2023-05-10 DIAGNOSIS — J329 Chronic sinusitis, unspecified: Secondary | ICD-10-CM | POA: Diagnosis not present

## 2023-05-11 DIAGNOSIS — S52531A Colles' fracture of right radius, initial encounter for closed fracture: Secondary | ICD-10-CM | POA: Diagnosis not present

## 2023-06-07 DIAGNOSIS — F4322 Adjustment disorder with anxiety: Secondary | ICD-10-CM | POA: Diagnosis not present

## 2023-06-10 DIAGNOSIS — S52531D Colles' fracture of right radius, subsequent encounter for closed fracture with routine healing: Secondary | ICD-10-CM | POA: Diagnosis not present

## 2023-07-08 DIAGNOSIS — F4322 Adjustment disorder with anxiety: Secondary | ICD-10-CM | POA: Diagnosis not present

## 2023-07-15 DIAGNOSIS — S52531D Colles' fracture of right radius, subsequent encounter for closed fracture with routine healing: Secondary | ICD-10-CM | POA: Diagnosis not present

## 2023-07-26 ENCOUNTER — Other Ambulatory Visit: Payer: Self-pay

## 2023-07-26 ENCOUNTER — Encounter: Payer: Self-pay | Admitting: Emergency Medicine

## 2023-07-26 ENCOUNTER — Ambulatory Visit
Admission: EM | Admit: 2023-07-26 | Discharge: 2023-07-26 | Disposition: A | Discharge status: Home / Self Care | Payer: BC Managed Care – PPO | Attending: Family Medicine | Admitting: Family Medicine

## 2023-07-26 DIAGNOSIS — J3089 Other allergic rhinitis: Secondary | ICD-10-CM | POA: Diagnosis not present

## 2023-07-26 DIAGNOSIS — J209 Acute bronchitis, unspecified: Secondary | ICD-10-CM

## 2023-07-26 MED ORDER — PREDNISONE 20 MG PO TABS: 40.0000 mg | ORAL_TABLET | Freq: Every day | ORAL | 0 refills | Status: DC

## 2023-07-26 MED ORDER — PROMETHAZINE-DM 6.25-15 MG/5ML PO SYRP
5.0000 mL | ORAL_SOLUTION | Freq: Four times a day (QID) | ORAL | 0 refills | Status: AC | PRN
Start: 2023-07-26 — End: ?

## 2023-07-26 NOTE — ED Triage Notes (Signed)
Pt reports cough x2 weeks. Reports at work things are being renovated and reports several irritants at work are making it worse.

## 2023-07-26 NOTE — ED Provider Notes (Signed)
[facility]    CSN: [id number] Arrival date & time: 07/26/23  1734      History   Chief Complaint Chief Complaint  Patient presents with   Cough    HPI Briana Clark is a 53 y.o. female.   Patient presenting today with 2-week history of postnasal drainage, dry hacking cough.  Denies fever, chills, chest pain, shortness of breath, wheezing, abdominal pain, vomiting, diarrhea.  Taking Flonase, saline sinus rinses and using Mucinex with minimal relief.  No known history of chronic pulmonary disease.  States they are doing renovations at work and the dust seems to be irritating her.    Past Medical History:  Diagnosis Date   Hemangioma    right side/UE and LE   Hyperlipidemia     Patient Active Problem List   Diagnosis Date Noted   Anxiety 02/16/2018   Tobacco abuse counseling 02/16/2018   Mixed hyperlipidemia 02/16/2018    Past Surgical History:  Procedure Laterality Date   ABDOMINAL HYSTERECTOMY     TONSILLECTOMY      OB History     Gravida  2   Para  2   Term  2   Preterm      AB      Living  2      SAB      IAB      Ectopic      Multiple      Live Births               Home Medications    Prior to Admission medications   Medication Sig Start Date End Date Taking? Authorizing Provider  predniSONE (DELTASONE) 20 MG tablet Take 2 tablets (40 mg total) by mouth daily with breakfast. 07/26/23  Yes Particia Nearing, PA-C  promethazine-dextromethorphan (PROMETHAZINE-DM) 6.25-15 MG/5ML syrup Take 5 mLs by mouth 4 (four) times daily as needed. 07/26/23  Yes Particia Nearing, PA-C  Fezolinetant (VEOZAH) 45 MG TABS Take 1 tablet (45 mg total) by mouth at bedtime. 03/15/23   Lazaro Arms, MD  FLUoxetine (PROZAC) 40 MG capsule Take 1 capsule (40 mg total) by mouth daily. Patient taking differently: Take 80 mg by mouth daily. 02/16/18   Aliene Beams, MD  hydrOXYzine (ATARAX) 10 MG tablet Take 10 mg by mouth 3 (three) times  daily as needed.    [provider]  loratadine-pseudoephedrine (CLARITIN-D 24-HOUR) 10-240 MG 24 hr tablet Take 1 tablet by mouth daily.    [provider]  oxyCODONE-acetaminophen (PERCOCET/ROXICET) 5-325 MG tablet Take 1 tablet by mouth every 6 (six) hours as needed for severe pain (pain score 7-10). 05/04/23   Achille Rich, PA-C    Family History Family History  Problem Relation Age of Onset   Asthma Mother    COPD Mother    Hashimoto's thyroiditis Mother    Brain cancer Father    Thyroid cancer Sister    Hypothyroidism Sister    Luiz Blare' disease Sister    Goiter Brother    CAD Paternal Grandfather     Social History Social History   Tobacco Use   Smoking status: Every Day    Current packs/day: 0.50    Average packs/day: 0.5 packs/day for 26.0 years (13.0 ttl pk-yrs)    Types: Cigarettes   Smokeless tobacco: Never   Tobacco comments:    Chantix did not work-SI; welbutrin caused constipation  Vaping Use   Vaping status: Never Used  Substance Use Topics   Alcohol use:  Yes    Comment: occ   Drug use: No     Allergies   Latex and Penicillins   Review of Systems Review of Systems Per HPI  Physical Exam Triage Vital Signs ED Triage Vitals  Encounter Vitals Group     BP 07/26/23 1853 104/73     Systolic BP Percentile --      Diastolic BP Percentile --      Pulse Rate 07/26/23 1853 88     Resp 07/26/23 1853 16     Temp 07/26/23 1853 98.3 F (36.8 C)     Temp Source 07/26/23 1853 Oral     SpO2 07/26/23 1853 96 %     Weight --      Height --      Head Circumference --      Peak Flow --      Pain Score 07/26/23 1856 0     Pain Loc --      Pain Education --      Exclude from Growth Chart --    No data found.  Updated Vital Signs BP 104/73 (BP Location: Right Arm)   Pulse 88   Temp 98.3 F (36.8 C) (Oral)   Resp 16   SpO2 96%   Visual Acuity Right Eye Distance:   Left Eye Distance:   Bilateral Distance:    Right Eye Near:    Left Eye Near:    Bilateral Near:     Physical Exam Vitals and nursing note reviewed.  Constitutional:      Appearance: Normal appearance.  HENT:     Head: Atraumatic.     Right Ear: Tympanic membrane and external ear normal.     Left Ear: Tympanic membrane and external ear normal.     Nose: Rhinorrhea present. No congestion.     Mouth/Throat:     Mouth: Mucous membranes are moist.     Pharynx: No posterior oropharyngeal erythema.  Eyes:     Extraocular Movements: Extraocular movements intact.     Conjunctiva/sclera: Conjunctivae normal.  Cardiovascular:     Rate and Rhythm: Normal rate and regular rhythm.     Heart sounds: Normal heart sounds.  Pulmonary:     Effort: Pulmonary effort is normal.     Breath sounds: Normal breath sounds. No wheezing or rales.  Musculoskeletal:        General: Normal range of motion.     Cervical back: Normal range of motion and neck supple.  Skin:    General: Skin is warm and dry.  Neurological:     Mental Status: She is alert and oriented to person, place, and time.  Psychiatric:        Mood and Affect: Mood normal.        Thought Content: Thought content normal.      UC Treatments / Results  Labs (all labs ordered are listed, but only abnormal results are displayed) Labs Reviewed - No data to display  EKG   Radiology No results found.  Procedures Procedures (including critical care time)  Medications Ordered in UC Medications - No data to display  Initial Impression / Assessment and Plan / UC Course  I have reviewed the triage vital signs and the nursing notes.  Pertinent labs & imaging results that were available during my care of the patient were reviewed by me and considered in my medical decision making (see chart for details).     Suspect seasonal allergy exacerbation causing bronchitis.  Treat  with prednisone, Phenergan DM, supportive over-the-counter medications and home care.  Return for worsening  symptoms.  Final Clinical Impressions(s) / UC Diagnoses   Final diagnoses:  Acute bronchitis, unspecified organism  Seasonal allergic rhinitis due to other allergic trigger   Discharge Instructions   None    ED Prescriptions     Medication Sig Dispense Auth. Provider   predniSONE (DELTASONE) 20 MG tablet Take 2 tablets (40 mg total) by mouth daily with breakfast. 10 tablet Particia Nearing, PA-C   promethazine-dextromethorphan (PROMETHAZINE-DM) 6.25-15 MG/5ML syrup Take 5 mLs by mouth 4 (four) times daily as needed. 100 mL Particia Nearing, [place]      PDMP not reviewed this encounter.   Particia Nearing, [place] 07/26/23 1928

## 2023-08-08 ENCOUNTER — Ambulatory Visit: Admission: EM | Admit: 2023-08-08 | Discharge: 2023-08-08 | Disposition: A | Discharge status: Home / Self Care

## 2023-08-08 DIAGNOSIS — J0100 Acute maxillary sinusitis, unspecified: Secondary | ICD-10-CM

## 2023-08-08 MED ORDER — DOXYCYCLINE HYCLATE 100 MG PO CAPS: 100.0000 mg | ORAL_CAPSULE | Freq: Two times a day (BID) | ORAL | 0 refills | Status: AC

## 2023-08-08 NOTE — Discharge Instructions (Addendum)
 You have a sinus infection.  Take the doxycycline as prescribed to treat it.  Continue nasal lavages and humidifier and drinking plenty of water.

## 2023-08-08 NOTE — ED Triage Notes (Signed)
 Sinus pressure and pain, headache x 1 month.  Tried prescribed medication from 07/26/23 visit with no improvement of symptoms.

## 2023-08-08 NOTE — ED Provider Notes (Signed)
 [facility]    CSN: [id number] Arrival date & time: 08/08/23  0855      History   Chief Complaint Chief Complaint  Patient presents with   Facial Pain    HPI Briana Clark is a 53 y.o. female.   Patient presents today with complaints of not feeling all the way better after she was seen on 07/26/2023 and treated for bronchitis.  She reports of bronchitis is much better, however she has still having stuffy nose, postnasal drainage, sinus pressure and headache, pressure in her cheeks, decreased appetite, and fatigue.  She denies recent fever, bodyaches or chills, cough, shortness of breath or chest pain, sore throat, abdominal pain, nausea/vomiting, and diarrhea.  Has been using a nasal lavage, taking Advil congestion, using humidifier, and sending over a pot of boiling water on the stove which all seem to help with symptoms temporarily.  She reports history of sinus infections in the past.    Past Medical History:  Diagnosis Date   Hemangioma    right side/UE and LE   Hyperlipidemia     Patient Active Problem List   Diagnosis Date Noted   Anxiety 02/16/2018   Tobacco abuse counseling 02/16/2018   Mixed hyperlipidemia 02/16/2018    Past Surgical History:  Procedure Laterality Date   ABDOMINAL HYSTERECTOMY     TONSILLECTOMY      OB History     Gravida  2   Para  2   Term  2   Preterm      AB      Living  2      SAB      IAB      Ectopic      Multiple      Live Births               Home Medications    Prior to Admission medications   Medication Sig Start Date End Date Taking? Authorizing Provider  doxycycline (VIBRAMYCIN) 100 MG capsule Take 1 capsule (100 mg total) by mouth 2 (two) times daily for 7 days. 08/08/23 08/15/23 Yes Valentino Nose, NP  FLUoxetine (PROZAC) 40 MG capsule Take 1 capsule (40 mg total) by mouth daily. Patient taking differently: Take 80 mg by mouth daily. 02/16/18  Yes Aliene Beams, MD  hydrOXYzine  (ATARAX) 10 MG tablet Take 10 mg by mouth 3 (three) times daily as needed.   Yes [provider]  predniSONE (DELTASONE) 20 MG tablet Take 2 tablets (40 mg total) by mouth daily with breakfast. 07/26/23  Yes Particia Nearing, PA-C  rosuvastatin (CRESTOR) 10 MG tablet Take 10 mg by mouth at bedtime. 08/01/23  Yes [provider]  Fezolinetant (VEOZAH) 45 MG TABS Take 1 tablet (45 mg total) by mouth at bedtime. Patient not taking: Reported on 08/08/2023 03/15/23   Lazaro Arms, MD  loratadine-pseudoephedrine (CLARITIN-D 24-HOUR) 10-240 MG 24 hr tablet Take 1 tablet by mouth daily.    [provider]  oxyCODONE-acetaminophen (PERCOCET/ROXICET) 5-325 MG tablet Take 1 tablet by mouth every 6 (six) hours as needed for severe pain (pain score 7-10). 05/04/23   Achille Rich, PA-C  promethazine-dextromethorphan (PROMETHAZINE-DM) 6.25-15 MG/5ML syrup Take 5 mLs by mouth 4 (four) times daily as needed. 07/26/23   Particia Nearing, PA-C    Family History Family History  Problem Relation Age of Onset   Asthma Mother    COPD Mother    Hashimoto's thyroiditis Mother    Brain cancer Father  Thyroid cancer Sister    Hypothyroidism Sister    Graves' disease Sister    Goiter Brother    CAD Paternal Grandfather     Social History Social History   Tobacco Use   Smoking status: Every Day    Current packs/day: 0.50    Average packs/day: 0.5 packs/day for 26.0 years (13.0 ttl pk-yrs)    Types: Cigarettes   Smokeless tobacco: Never   Tobacco comments:    Chantix did not work-SI; welbutrin caused constipation  Vaping Use   Vaping status: Never Used  Substance Use Topics   Alcohol use: Yes    Comment: occ   Drug use: No     Allergies   Latex and Penicillins   Review of Systems Review of Systems Per HPI  Physical Exam Triage Vital Signs ED Triage Vitals [08/08/23 1027]  Encounter Vitals Group     BP 112/77     Systolic BP Percentile      Diastolic  BP Percentile      Pulse Rate 86     Resp 18     Temp 98.1 F (36.7 C)     Temp Source Oral     SpO2 98 %     Weight      Height      Head Circumference      Peak Flow      Pain Score 6     Pain Loc      Pain Education      Exclude from Growth Chart    No data found.  Updated Vital Signs BP 112/77 (BP Location: Right Arm)   Pulse 86   Temp 98.1 F (36.7 C) (Oral)   Resp 18   SpO2 98%   Visual Acuity Right Eye Distance:   Left Eye Distance:   Bilateral Distance:    Right Eye Near:   Left Eye Near:    Bilateral Near:     Physical Exam Vitals and nursing note reviewed.  Constitutional:      General: She is not in acute distress.    Appearance: Normal appearance. She is not ill-appearing or toxic-appearing.  HENT:     Head: Normocephalic and atraumatic.     Right Ear: Tympanic membrane, ear canal and external ear normal.     Left Ear: Tympanic membrane, ear canal and external ear normal.     Nose: Congestion present. No rhinorrhea.     Right Sinus: Maxillary sinus tenderness present. No frontal sinus tenderness.     Left Sinus: Maxillary sinus tenderness present. No frontal sinus tenderness.     Mouth/Throat:     Mouth: Mucous membranes are moist.     Pharynx: Oropharynx is clear. No oropharyngeal exudate or posterior oropharyngeal erythema.     Tonsils: No tonsillar exudate. 0 on the right. 0 on the left.  Eyes:     General: No scleral icterus.    Extraocular Movements: Extraocular movements intact.  Cardiovascular:     Rate and Rhythm: Normal rate and regular rhythm.  Pulmonary:     Effort: Pulmonary effort is normal. No respiratory distress.     Breath sounds: Normal breath sounds. No wheezing, rhonchi or rales.  Musculoskeletal:     Cervical back: Normal range of motion and neck supple.  Lymphadenopathy:     Cervical: No cervical adenopathy.  Skin:    General: Skin is warm and dry.     Capillary Refill: Capillary refill takes less than 2 seconds.  Coloration: Skin is not jaundiced or pale.     Findings: No erythema or rash.  Neurological:     Mental Status: She is alert and oriented to person, place, and time.  Psychiatric:        Behavior: Behavior is cooperative.      UC Treatments / Results  Labs (all labs ordered are listed, but only abnormal results are displayed) Labs Reviewed - No data to display  EKG   Radiology No results found.  Procedures Procedures (including critical care time)  Medications Ordered in UC Medications - No data to display  Initial Impression / Assessment and Plan / UC Course  I have reviewed the triage vital signs and the nursing notes.  Pertinent labs & imaging results that were available during my care of the patient were reviewed by me and considered in my medical decision making (see chart for details).   Patient is well-appearing, normotensive, afebrile, not tachycardic, not tachypneic, oxygenating well on room air.    1. Acute non-recurrent maxillary sinusitis Given allergy to penicillin, will treat with doxycycline twice daily for 7 days Other supportive care discussed including continue nasal lavages, humidifier, start guaifenesin Return and ER precautions discussed with patient Work excuse provided  The patient was given the opportunity to ask questions.  All questions answered to their satisfaction.  The patient is in agreement to this plan.    Final Clinical Impressions(s) / UC Diagnoses   Final diagnoses:  Acute non-recurrent maxillary sinusitis     Discharge Instructions      You have a sinus infection.  Take the doxycycline as prescribed to treat it.  Continue nasal lavages and humidifier and drinking plenty of water.      ED Prescriptions     Medication Sig Dispense Auth. Provider   doxycycline (VIBRAMYCIN) 100 MG capsule Take 1 capsule (100 mg total) by mouth 2 (two) times daily for 7 days. 14 capsule Valentino Nose, NP      PDMP not reviewed this  encounter.   Valentino Nose, NP 08/08/23 [phone]

## 2023-08-22 DIAGNOSIS — R0989 Other specified symptoms and signs involving the circulatory and respiratory systems: Secondary | ICD-10-CM | POA: Diagnosis not present

## 2023-08-22 DIAGNOSIS — Z87898 Personal history of other specified conditions: Secondary | ICD-10-CM | POA: Diagnosis not present

## 2023-08-22 DIAGNOSIS — L853 Xerosis cutis: Secondary | ICD-10-CM | POA: Diagnosis not present

## 2023-08-22 DIAGNOSIS — R5383 Other fatigue: Secondary | ICD-10-CM | POA: Diagnosis not present

## 2023-08-26 DIAGNOSIS — D649 Anemia, unspecified: Secondary | ICD-10-CM | POA: Diagnosis not present

## 2023-08-31 DIAGNOSIS — K047 Periapical abscess without sinus: Secondary | ICD-10-CM | POA: Diagnosis not present

## 2023-09-09 DIAGNOSIS — R197 Diarrhea, unspecified: Secondary | ICD-10-CM | POA: Diagnosis not present

## 2023-09-09 DIAGNOSIS — K219 Gastro-esophageal reflux disease without esophagitis: Secondary | ICD-10-CM | POA: Diagnosis not present

## 2023-09-09 DIAGNOSIS — E538 Deficiency of other specified B group vitamins: Secondary | ICD-10-CM | POA: Diagnosis not present

## 2023-09-09 DIAGNOSIS — D509 Iron deficiency anemia, unspecified: Secondary | ICD-10-CM | POA: Diagnosis not present

## 2023-09-12 ENCOUNTER — Other Ambulatory Visit: Payer: Self-pay | Admitting: Family Medicine

## 2023-09-12 DIAGNOSIS — R590 Localized enlarged lymph nodes: Secondary | ICD-10-CM

## 2023-09-13 ENCOUNTER — Emergency Department (HOSPITAL_COMMUNITY)

## 2023-09-13 ENCOUNTER — Emergency Department (HOSPITAL_COMMUNITY)
Admission: EM | Admit: 2023-09-13 | Discharge: 2023-09-13 | Disposition: A | Discharge status: Home / Self Care | Attending: Emergency Medicine | Admitting: Emergency Medicine

## 2023-09-13 ENCOUNTER — Encounter (HOSPITAL_COMMUNITY): Payer: Self-pay

## 2023-09-13 ENCOUNTER — Other Ambulatory Visit: Payer: Self-pay

## 2023-09-13 DIAGNOSIS — M542 Cervicalgia: Secondary | ICD-10-CM | POA: Diagnosis not present

## 2023-09-13 DIAGNOSIS — R221 Localized swelling, mass and lump, neck: Secondary | ICD-10-CM

## 2023-09-13 DIAGNOSIS — F17200 Nicotine dependence, unspecified, uncomplicated: Secondary | ICD-10-CM | POA: Diagnosis not present

## 2023-09-13 DIAGNOSIS — J351 Hypertrophy of tonsils: Secondary | ICD-10-CM | POA: Diagnosis not present

## 2023-09-13 DIAGNOSIS — Z91040 Latex allergy status: Secondary | ICD-10-CM | POA: Diagnosis not present

## 2023-09-13 DIAGNOSIS — R1310 Dysphagia, unspecified: Secondary | ICD-10-CM | POA: Diagnosis not present

## 2023-09-13 LAB — CBC WITH DIFFERENTIAL/PLATELET
Abs Immature Granulocytes: 0.03 10*3/uL (ref 0.00–0.07)
Basophils Absolute: 0 10*3/uL (ref 0.0–0.1)
Basophils Relative: 0 %
Eosinophils Absolute: 0.1 10*3/uL (ref 0.0–0.5)
Eosinophils Relative: 1 %
HCT: 34.8 % — ABNORMAL LOW (ref 36.0–46.0)
Hemoglobin: 10.4 g/dL — ABNORMAL LOW (ref 12.0–15.0)
Immature Granulocytes: 0 %
Lymphocytes Relative: 26 %
Lymphs Abs: 2.4 10*3/uL (ref 0.7–4.0)
MCH: 22.3 pg — ABNORMAL LOW (ref 26.0–34.0)
MCHC: 29.9 g/dL — ABNORMAL LOW (ref 30.0–36.0)
MCV: 74.5 fL — ABNORMAL LOW (ref 80.0–100.0)
Monocytes Absolute: 0.6 10*3/uL (ref 0.1–1.0)
Monocytes Relative: 6 %
Neutro Abs: 6.2 10*3/uL (ref 1.7–7.7)
Neutrophils Relative %: 67 %
Platelets: 470 10*3/uL — ABNORMAL HIGH (ref 150–400)
RBC: 4.67 MIL/uL (ref 3.87–5.11)
RDW: 15.2 % (ref 11.5–15.5)
WBC: 9.3 10*3/uL (ref 4.0–10.5)
nRBC: 0 % (ref 0.0–0.2)

## 2023-09-13 LAB — I-STAT CHEM 8, ED
BUN: 7 mg/dL (ref 6–20)
Calcium, Ion: 1.18 mmol/L (ref 1.15–1.40)
Chloride: 105 mmol/L (ref 98–111)
Creatinine, Ser: 0.8 mg/dL (ref 0.44–1.00)
Glucose, Bld: 83 mg/dL (ref 70–99)
HCT: 35 % — ABNORMAL LOW (ref 36.0–46.0)
Hemoglobin: 11.9 g/dL — ABNORMAL LOW (ref 12.0–15.0)
Potassium: 3.8 mmol/L (ref 3.5–5.1)
Sodium: 139 mmol/L (ref 135–145)
TCO2: 25 mmol/L (ref 22–32)

## 2023-09-13 MED ORDER — IOHEXOL 300 MG/ML  SOLN
75.0000 mL | Freq: Once | INTRAMUSCULAR | Status: AC | PRN
  Administered 2023-09-13: 75 mL via INTRAVENOUS

## 2023-09-13 MED ORDER — SODIUM CHLORIDE (PF) 0.9 % IJ SOLN
INTRAMUSCULAR | Status: AC
  Filled 2023-09-13: qty 50

## 2023-09-13 NOTE — Discharge Instructions (Signed)
 You have been evaluated for your symptoms.  CT scan today demonstrating 2 enhancing masses on the right parotid region of your neck which are concerning for potential cancer versus benign tumor.  Please call and follow-up closely with ENT specialist for further evaluation of your condition.  Return if you have any concern.

## 2023-09-13 NOTE — ED Triage Notes (Signed)
 Pt arrived reporting R side neck pain/swelling for 3 weeks. States she thought it was her tooth. However,went to dentist had an xray and was told to go to ED for eval. Intermittent difficulty swallowing. No shob. No other concerns

## 2023-09-13 NOTE — ED Provider Notes (Signed)
 [place] EMERGENCY DEPARTMENT AT [facility] Provider Note   CSN: [id number] Arrival date & time: 09/13/23  1048     History  Chief Complaint  Patient presents with   Neck Pain    Briana Clark is a 53 y.o. female.  The history is provided by the patient and medical records. No language interpreter was used.  Neck Pain    53 year old female with history of anxiety presenting with complaint of neck pain.  Patient states 3 weeks ago she had a dental procedure done by her dentist.  Few days after she noticed pain and swelling to the right side of her neck near the site of her surgery.  Since then she has had steroids, antibiotics, and the discomfort is still present.  She noticed increasing pain at nighttime when she turns her neck.  She does not endorse any fever no trouble swallowing no abnormal weight changes.  She does endorse night sweat but attributed to her menopause state.  She is a smoker.  She was seen by her dentist this morning and he felt her symptom is not related to a dental infection as she does not have any dental pain.  He recommend patient to come to the ER for further evaluation.  Home Medications Prior to Admission medications   Medication Sig Start Date End Date Taking? Authorizing Provider  Fezolinetant (VEOZAH) 45 MG TABS Take 1 tablet (45 mg total) by mouth at bedtime. Patient not taking: Reported on 08/08/2023 03/15/23   Lazaro Arms, MD  FLUoxetine (PROZAC) 40 MG capsule Take 1 capsule (40 mg total) by mouth daily. Patient taking differently: Take 80 mg by mouth daily. 02/16/18   Aliene Beams, MD  hydrOXYzine (ATARAX) 10 MG tablet Take 10 mg by mouth 3 (three) times daily as needed.    [provider]  loratadine-pseudoephedrine (CLARITIN-D 24-HOUR) 10-240 MG 24 hr tablet Take 1 tablet by mouth daily.    [provider]  oxyCODONE-acetaminophen (PERCOCET/ROXICET) 5-325 MG tablet Take 1 tablet by mouth every 6 (six) hours as needed  for severe pain (pain score 7-10). 05/04/23   Achille Rich, PA-C  predniSONE (DELTASONE) 20 MG tablet Take 2 tablets (40 mg total) by mouth daily with breakfast. 07/26/23   Particia Nearing, PA-C  promethazine-dextromethorphan (PROMETHAZINE-DM) 6.25-15 MG/5ML syrup Take 5 mLs by mouth 4 (four) times daily as needed. 07/26/23   Particia Nearing, PA-C  rosuvastatin (CRESTOR) 10 MG tablet Take 10 mg by mouth at bedtime. 08/01/23   [provider]      Allergies    Latex and Penicillins    Review of Systems   Review of Systems  Musculoskeletal:  Positive for neck pain.  All other systems reviewed and are negative.   Physical Exam Updated Vital Signs BP (!) 147/80   Pulse 88   Temp (!) 97.5 F (36.4 C) (Oral)   Resp 18   Ht 5\' 2"  (1.575 m)   Wt 78 kg   SpO2 100%   BMI 31.45 kg/m  Physical Exam Vitals and nursing note reviewed.  Constitutional:      General: She is not in acute distress.    Appearance: She is well-developed.  HENT:     Head: Atraumatic.     Mouth/Throat:     Mouth: Mucous membranes are moist.     Comments: No dental tenderness no obvious dental abscess Eyes:     Conjunctiva/sclera: Conjunctivae normal.  Cardiovascular:     Rate and  Rhythm: Normal rate and regular rhythm.     Pulses: Normal pulses.     Heart sounds: Normal heart sounds.  Pulmonary:     Effort: Pulmonary effort is normal.  Musculoskeletal:     Cervical back: Neck supple. No rigidity.  Lymphadenopathy:     Cervical: Cervical adenopathy (Mild swelling noted to the right cervical anterior chain without any pulsatile mass no overlying skin changes and no warmth.  Neck with full range of motion) present.  Skin:    Findings: No rash.  Neurological:     Mental Status: She is alert.  Psychiatric:        Mood and Affect: Mood normal.     ED Results / Procedures / Treatments   Labs (all labs ordered are listed, but only abnormal results are displayed) Labs Reviewed  CBC  WITH DIFFERENTIAL/PLATELET - Abnormal; Notable for the following components:      Result Value   Hemoglobin 10.4 (*)    HCT 34.8 (*)    MCV 74.5 (*)    MCH 22.3 (*)    MCHC 29.9 (*)    Platelets 470 (*)    All other components within normal limits  I-STAT CHEM 8, ED - Abnormal; Notable for the following components:   Hemoglobin 11.9 (*)    HCT 35.0 (*)    All other components within normal limits    EKG None  Radiology CT Soft Tissue Neck W Contrast Result Date: 09/13/2023 CLINICAL DATA:  53 year old female with right side neck pain and swelling for 3 weeks. Difficulty swallowing. EXAM: CT NECK WITH CONTRAST TECHNIQUE: Multidetector CT imaging of the neck was performed using the standard protocol following the bolus administration of intravenous contrast. RADIATION DOSE REDUCTION: This exam was performed according to the departmental dose-optimization program which includes automated exposure control, adjustment of the mA and/or kV according to patient size and/or use of iterative reconstruction technique. CONTRAST:  75mL OMNIPAQUE IOHEXOL 300 MG/ML  SOLN COMPARISON:  Chest CT 12/22/2007. FINDINGS: Pharynx and larynx: Laryngeal soft tissue contours including the epiglottis are within normal limits. There is lingual tonsil hypertrophy, but symmetric and likely physiologic. Evidence of previous palatine tonsillectomy, adenoidectomy. Pharyngeal soft tissue contours, parapharyngeal spaces are within normal limits. Partially retropharyngeal course of the carotids, otherwise negative retropharyngeal space. Salivary glands: Negative sublingual space, submandibular glands, left parotid gland. Marked area of palpable concern over the right upper neck, lateral face corresponds to the right parotid space and there is an underlying round and enhancing soft tissue mass of the posterior superficial right parotid lobe there measuring 13 x 15 x 17 mm (AP by transverse by CC). However, there is a nearby similar  appearing oval mass of the posterior deep lobe directly posterior to the angle of the right mandible. That lesion is slightly more heterogeneously enhancing and encompasses 12 x 13 x 19 mm. Both lesions are visible on coronal image 64. No regional inflammation. Bilateral stylomastoid foramen soft tissue appears symmetric and within normal limits. Thyroid: Negative. Lymph nodes: Right level 2A lymph node nearest the parotid is 8-9 mm short axis, upper limits of normal, not heterogeneous. Other bilateral cervical lymph nodes are smaller. Left level 2 nodes are up to 6 mm. No cystic or necrotic node. Vascular: Major vascular structures in the neck and at the skull base are patent with no significant atherosclerosis. Partially retropharyngeal course of both carotids. Limited intracranial: Negative. Visualized orbits: Negative. Mastoids and visualized paranasal sinuses: Clear. Skeleton: Ordinary cervical spine degeneration. No acute  or suspicious osseous lesion identified. Upper chest: Respiratory motion. Negative visible superior mediastinum. Grossly negative lungs. IMPRESSION: 1. Marked area of concern corresponds to the right parotid space where two separate oval enhancing masses are up to 1.9 cm individually. These are most compatible with Primary Salivary Neoplasms. The imaging appearance is nonspecific, and there is a nearby maximal right level 2 cervical lymph node, but multiplicity in the parotid generally favors a benign histology such as Warthin's tumor. Recommend ENT follow-up. 2. Otherwise negative CT appearance of the Neck. Electronically Signed   By: Odessa Fleming M.D.   On: 09/13/2023 14:15    Procedures Procedures    Medications Ordered in ED Medications  iohexol (OMNIPAQUE) 300 MG/ML solution 75 mL (75 mLs Intravenous Contrast Given 09/13/23 1341)    ED Course/ Medical Decision Making/ A&P                                 Medical Decision Making Amount and/or Complexity of Data Reviewed Labs:  ordered. Radiology: ordered.  Risk Prescription drug management.   BP (!) 147/80   Pulse 88   Temp (!) 97.5 F (36.4 C) (Oral)   Resp 18   Ht 5\' 2"  (1.575 m)   Wt 78 kg   SpO2 100%   BMI 31.45 kg/m   22:65 PM  53 year old female with history of anxiety presenting with complaint of neck pain.  Patient states 3 weeks ago she had a dental procedure done by her dentist.  Few days after she noticed pain and swelling to the right side of her neck near the site of her surgery.  Since then she has had steroids, antibiotics, and the discomfort is still present.  She noticed increasing pain at nighttime when she turns her neck.  She does not endorse any fever no trouble swallowing no abnormal weight changes.  She does endorse night sweat but attributed to her menopause state.  She is a smoker.  She was seen by her dentist this morning and he felt her symptom is not related to a dental infection as she does not have any dental pain.  He recommend patient to come to the ER for further evaluation.  On exam this is a well-appearing female resting comfortably in the chair appears to be in no acute discomfort.  Oral exam unremarkable.  She has a dental filling noted to tooth 23 without any tenderness to palpation.  She has a mild shotty lymphadenopathy noted to her right anterior cervical chain with some mild tenderness to palpation but no pulsatile mass no obvious overlying skin changes.  Her trachea is midline.  -Labs ordered, independently viewed and interpreted by me.  Labs remarkable for Hgb 10.4 -The patient was maintained on a cardiac monitor.  I personally viewed and interpreted the cardiac monitored which showed an underlying rhythm of: NSR -Imaging independently viewed and interpreted by me and I agree with radiologist's interpretation.  Result remarkable for CT neck soft tissue showing enhancing masses concerning for Primary Salivary Neoplasms vs. Warthin's tumor.  Will have pt f/u with ENT for  further evaluation -This patient presents to the ED for concern of neck mass, this involves an extensive number of treatment options, and is a complaint that carries with it a high risk of complications and morbidity.  The differential diagnosis includes reactive lymph nodes, malignancy, pseudoaneurysm, abscess -Co morbidities that complicate the patient evaluation includes anxiety -Treatment includes supportive care -  Reevaluation of the patient after these medicines showed that the patient stayed the same -PCP office notes or outside notes reviewed -Discussion with attending Dr. Dolan Freiberg.  -Escalation to admission/observation considered: patients feels much better, is comfortable with discharge, and will follow up with ENT -Prescription medication considered, patient comfortable with OTC meds -Social Determinant of Health considered which includes tobacco use  Have patient follow-up with [facility]NT for outpatient evaluation.  She would likely benefit from biopsy of these lesions         Final Clinical Impression(s) / ED Diagnoses Final diagnoses:  Mass of right side of neck    Rx / DC Orders ED Discharge Orders     None         Debbra Fairy, PA-C 09/13/23 1436    Long, Shereen Dike, MD 09/17/23 [phone]

## 2023-09-19 DIAGNOSIS — D490 Neoplasm of unspecified behavior of digestive system: Secondary | ICD-10-CM | POA: Diagnosis not present

## 2023-09-22 ENCOUNTER — Other Ambulatory Visit (HOSPITAL_COMMUNITY): Payer: Self-pay | Admitting: Otolaryngology

## 2023-09-22 DIAGNOSIS — D490 Neoplasm of unspecified behavior of digestive system: Secondary | ICD-10-CM

## 2023-09-26 NOTE — Progress Notes (Signed)
 Briana Asper, DO  Briana Clark; P Ir Procedure Requests OK for US  guided FNA biopsy of right parotid mass.  Briana Clark       Previous Messages    ----- Message ----- From: Briana Clark Sent: 09/22/2023   9:29 AM EDT To: Briana Clark; Ir Procedure Requests Subject: US  FNA bx                                      Procedure : US  FBA bx  Reason: Parotid neoplasm  History : CT soft tissue neck w/o  Provider : Daleen Dubs, DO  Provider contact:  [phone]

## 2023-09-28 ENCOUNTER — Other Ambulatory Visit

## 2023-10-17 ENCOUNTER — Other Ambulatory Visit: Payer: Self-pay | Admitting: Radiology

## 2023-10-17 DIAGNOSIS — D490 Neoplasm of unspecified behavior of digestive system: Secondary | ICD-10-CM

## 2023-10-18 ENCOUNTER — Ambulatory Visit (HOSPITAL_COMMUNITY)
Admission: RE | Admit: 2023-10-18 | Discharge: 2023-10-18 | Disposition: A | Discharge status: Home / Self Care | Source: Ambulatory Visit | Attending: Otolaryngology | Admitting: Otolaryngology

## 2023-10-18 ENCOUNTER — Other Ambulatory Visit: Payer: Self-pay

## 2023-10-18 ENCOUNTER — Encounter (HOSPITAL_COMMUNITY): Payer: Self-pay

## 2023-10-18 DIAGNOSIS — F17210 Nicotine dependence, cigarettes, uncomplicated: Secondary | ICD-10-CM | POA: Diagnosis not present

## 2023-10-18 DIAGNOSIS — K118 Other diseases of salivary glands: Secondary | ICD-10-CM | POA: Diagnosis not present

## 2023-10-18 DIAGNOSIS — D490 Neoplasm of unspecified behavior of digestive system: Secondary | ICD-10-CM

## 2023-10-18 DIAGNOSIS — D110 Benign neoplasm of parotid gland: Secondary | ICD-10-CM | POA: Insufficient documentation

## 2023-10-18 DIAGNOSIS — E785 Hyperlipidemia, unspecified: Secondary | ICD-10-CM | POA: Insufficient documentation

## 2023-10-18 LAB — CBC WITH DIFFERENTIAL/PLATELET
Abs Immature Granulocytes: 0.03 10*3/uL (ref 0.00–0.07)
Basophils Absolute: 0.1 10*3/uL (ref 0.0–0.1)
Basophils Relative: 1 %
Eosinophils Absolute: 0.1 10*3/uL (ref 0.0–0.5)
Eosinophils Relative: 1 %
HCT: 31.5 % — ABNORMAL LOW (ref 36.0–46.0)
Hemoglobin: 9.2 g/dL — ABNORMAL LOW (ref 12.0–15.0)
Immature Granulocytes: 0 %
Lymphocytes Relative: 25 %
Lymphs Abs: 2.4 10*3/uL (ref 0.7–4.0)
MCH: 20.9 pg — ABNORMAL LOW (ref 26.0–34.0)
MCHC: 29.2 g/dL — ABNORMAL LOW (ref 30.0–36.0)
MCV: 71.4 fL — ABNORMAL LOW (ref 80.0–100.0)
Monocytes Absolute: 0.7 10*3/uL (ref 0.1–1.0)
Monocytes Relative: 8 %
Neutro Abs: 6.1 10*3/uL (ref 1.7–7.7)
Neutrophils Relative %: 65 %
Platelets: 409 10*3/uL — ABNORMAL HIGH (ref 150–400)
RBC: 4.41 MIL/uL (ref 3.87–5.11)
RDW: 15.5 % (ref 11.5–15.5)
WBC: 9.4 10*3/uL (ref 4.0–10.5)
nRBC: 0 % (ref 0.0–0.2)

## 2023-10-18 LAB — PROTIME-INR
INR: 1 (ref 0.8–1.2)
Prothrombin Time: 13 s (ref 11.4–15.2)

## 2023-10-18 MED ORDER — SODIUM CHLORIDE 0.9 % IV SOLN: INTRAVENOUS | Status: DC

## 2023-10-18 MED ORDER — LIDOCAINE HCL 1 % IJ SOLN
INTRAMUSCULAR | Status: AC
  Filled 2023-10-18: qty 20

## 2023-10-18 MED ORDER — MIDAZOLAM HCL 2 MG/2ML IJ SOLN
INTRAMUSCULAR | Status: AC
  Filled 2023-10-18: qty 2

## 2023-10-18 MED ORDER — MIDAZOLAM HCL 2 MG/2ML IJ SOLN
INTRAMUSCULAR | Status: AC | PRN
  Administered 2023-10-18 (×2): 1 mg via INTRAVENOUS

## 2023-10-18 MED ORDER — FENTANYL CITRATE (PF) 100 MCG/2ML IJ SOLN
INTRAMUSCULAR | Status: AC | PRN
  Administered 2023-10-18 (×2): 50 ug via INTRAVENOUS

## 2023-10-18 MED ORDER — FENTANYL CITRATE (PF) 100 MCG/2ML IJ SOLN
INTRAMUSCULAR | Status: AC
  Filled 2023-10-18: qty 2

## 2023-10-18 NOTE — Procedures (Signed)
 Interventional Radiology Procedure Note  Procedure: US  Guided FNA Biopsy of right parotid mass  Complications: None  Estimated Blood Loss: < 10 mL  Findings: 25 G FNA biopsy of 1.7 cm right parotid mass performed under US  guidance.  5 FNA samples obtained: 2 pairs of slides and 3 in Cytolyt.  Nonda Bays. Nereida Banning, M.D Pager:  [phone]

## 2023-10-18 NOTE — H&P (Addendum)
 Chief Complaint: Patient was seen in consultation today for parotid lesion, with consideration for biopsy.  Referring Provider(s): Dr. Drucilla Georgis, DO; Dr. Dorena Gander, MD.  Supervising Physician: Erica Hau  Patient Status: [facility] - Out-pt  Patient is Full Code  History of Present Illness: Briana Clark is a 53 y.o. female  with PMHx notable for HLD, hemangioma of Ue and Le, and Right-sided parotid lesion.  Patient was seen by PCP, Dr. Barbar Bonus at [facility], subsequent to a dental procedure on 08/22/23 with swollen right cheek and submandibular lymphadenopathy. She was treated with antibiotics, but swelling did not resolve. CT soft tissue neck w/ CM on 4/08 was notable for "Marked area of concern corresponds to the right parotid space where two separate oval enhancing masses are up to 1.9 cm individually. These are most compatible with Primary Salivary Neoplasms. The imaging appearance is nonspecific, and there is a nearby maximal right level 2 cervical lymph node, but multiplicity in the parotid generally favors a benign histology such as Warthin's tumor. Recommend ENT follow-up"  Interventional Radiology was requested for parotid lesion biopsy. Request was reviewed and approved by Dr. Mabel Savage. Patient is scheduled for same in IR today.   Patient is alert and laying in bed, calm.  Patient is currently without any significant complaints.  Patient denies any fevers, headache, chest pain, SOB, cough, abdominal pain, nausea, vomiting or bleeding.     Past Medical History:  Diagnosis Date   Hemangioma    right side/UE and LE   Hyperlipidemia     Past Surgical History:  Procedure Laterality Date   ABDOMINAL HYSTERECTOMY     TONSILLECTOMY      Allergies: Latex and Penicillins  Medications: Prior to Admission medications   Medication Sig Start Date End Date Taking? Authorizing Provider  Fezolinetant  (VEOZAH ) 45 MG TABS Take 1 tablet (45 mg total) by mouth at  bedtime. Patient not taking: Reported on 08/08/2023 03/15/23   Wendelyn Halter, MD  FLUoxetine  (PROZAC ) 40 MG capsule Take 1 capsule (40 mg total) by mouth daily. Patient taking differently: Take 80 mg by mouth daily. 02/16/18   Dorena Gander, MD  hydrOXYzine (ATARAX) 10 MG tablet Take 10 mg by mouth 3 (three) times daily as needed.    [provider]  loratadine-pseudoephedrine (CLARITIN-D 24-HOUR) 10-240 MG 24 hr tablet Take 1 tablet by mouth daily.    [provider]  oxyCODONE -acetaminophen  (PERCOCET/ROXICET) 5-325 MG tablet Take 1 tablet by mouth every 6 (six) hours as needed for severe pain (pain score 7-10). 05/04/23   Spence Dux, PA-C  predniSONE  (DELTASONE ) 20 MG tablet Take 2 tablets (40 mg total) by mouth daily with breakfast. 07/26/23   Corbin Dess, PA-C  promethazine -dextromethorphan (PROMETHAZINE -DM) 6.25-15 MG/5ML syrup Take 5 mLs by mouth 4 (four) times daily as needed. 07/26/23   Corbin Dess, PA-C  rosuvastatin (CRESTOR) 10 MG tablet Take 10 mg by mouth at bedtime. 08/01/23   [provider]     Family History  Problem Relation Age of Onset   Asthma Mother    COPD Mother    Hashimoto's thyroiditis Mother    Brain cancer Father    Thyroid  cancer Sister    Hypothyroidism Sister    Murrell Arrant' disease Sister    Goiter Brother    CAD Paternal Grandfather     Social History   Socioeconomic History   Marital status: Married    Spouse name: Not on file   Number of children:  Not on file   Years of education: Not on file   Highest education level: Not on file  Occupational History   Not on file  Tobacco Use   Smoking status: Every Day    Current packs/day: 0.50    Average packs/day: 0.5 packs/day for 26.0 years (13.0 ttl pk-yrs)    Types: Cigarettes   Smokeless tobacco: Never   Tobacco comments:    Chantix did not work-SI; welbutrin caused constipation  Vaping Use   Vaping status: Never Used  Substance and Sexual Activity    Alcohol use: Yes    Comment: occ   Drug use: No   Sexual activity: Yes    Birth control/protection: Surgical  Other Topics Concern   Not on file  Social History Narrative   Lives in [place], [place].   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    Dogs, walks them regularly.    Eats all foods.   Wear seatbelt.   Enjoys reading and make wreaths.    Social Drivers of Corporate investment banker Strain: Low Risk  (03/15/2023)   Overall Financial Resource Strain (CARDIA)    Difficulty of Paying Living Expenses: Not hard at all  Food Insecurity: No Food Insecurity (03/15/2023)   Hunger Vital Sign    Worried About Running Out of Food in the Last Year: Never true    Ran Out of Food in the Last Year: Never true  Transportation Needs: No Transportation Needs (03/15/2023)   PRAPARE - Administrator, Civil Service (Medical): No    Lack of Transportation (Non-Medical): No  Physical Activity: Insufficiently Active (03/15/2023)   Exercise Vital Sign    Days of Exercise per Week: 2 days    Minutes of Exercise per Session: 20 min  Stress: Stress Concern Present (03/15/2023)   [employer] of Occupational Health - Occupational Stress Questionnaire    Feeling of Stress : Very much  Social Connections: Moderately Integrated (03/15/2023)   Social Connection and Isolation Panel [NHANES]    Frequency of Communication with Friends and Family: Once a week    Frequency of Social Gatherings with Friends and Family: Once a week    Attends Religious Services: More than 4 times per year    Active Member of [employer] or Organizations: Yes    Attends Engineer, structural: More than 4 times per year    Marital Status: Married     Review of Systems: A 12 point ROS discussed and pertinent positives are indicated in the HPI above.  All other systems are negative.  Vital Signs: BP 120/76   Pulse 84   Temp 98.4 F (36.9 C) (Oral)   Resp  16   SpO2 95%   Advance Care Plan: The advanced care place/surrogate decision maker was discussed at the time of visit and the patient did not wish to discuss or was not able to name a surrogate decision maker or provide an advance care plan.  Physical Exam Constitutional:      General: She is not in acute distress.    Appearance: Normal appearance.  HENT:     Mouth/Throat:     Mouth: Mucous membranes are dry.  Cardiovascular:     Rate and Rhythm: Normal rate and regular rhythm.     Heart sounds: No murmur heard. Pulmonary:     Effort: Pulmonary effort is normal.     Breath sounds: Normal breath sounds. No wheezing.  Musculoskeletal:        General: Normal range of motion.     Cervical back: Normal range of motion. No tenderness.  Skin:    General: Skin is warm and dry.  Neurological:     Mental Status: She is alert and oriented to person, place, and time.  Psychiatric:        Mood and Affect: Mood normal.        Behavior: Behavior normal.        Thought Content: Thought content normal.        Judgment: Judgment normal.     Imaging: CT Soft Tissue Neck w/ CM on 09/13/2023: Narrative & Impression CLINICAL DATA:  53 year old female with right side neck pain and swelling for 3 weeks. Difficulty swallowing.   EXAM: CT NECK WITH CONTRAST   TECHNIQUE: Multidetector CT imaging of the neck was performed using the standard protocol following the bolus administration of intravenous contrast.   RADIATION DOSE REDUCTION: This exam was performed according to the departmental dose-optimization program which includes automated exposure control, adjustment of the mA and/or kV according to patient size and/or use of iterative reconstruction technique.   CONTRAST:  75mL OMNIPAQUE  IOHEXOL  300 MG/ML  SOLN   COMPARISON:  Chest CT 12/22/2007.   FINDINGS: Pharynx and larynx: Laryngeal soft tissue contours including the epiglottis are within normal limits. There is lingual  tonsil hypertrophy, but symmetric and likely physiologic. Evidence of previous palatine tonsillectomy, adenoidectomy. Pharyngeal soft tissue contours, parapharyngeal spaces are within normal limits. Partially retropharyngeal course of the carotids, otherwise negative retropharyngeal space.   Salivary glands: Negative sublingual space, submandibular glands, left parotid gland.   Marked area of palpable concern over the right upper neck, lateral face corresponds to the right parotid space and there is an underlying round and enhancing soft tissue mass of the posterior superficial right parotid lobe there measuring 13 x 15 x 17 mm (AP by transverse by CC). However, there is a nearby similar appearing oval mass of the posterior deep lobe directly posterior to the angle of the right mandible. That lesion is slightly more heterogeneously enhancing and encompasses 12 x 13 x 19 mm. Both lesions are visible on coronal image 64. No regional inflammation. Bilateral stylomastoid foramen soft tissue appears symmetric and within normal limits.   Thyroid : Negative.   Lymph nodes: Right level 2A lymph node nearest the parotid is 8-9 mm short axis, upper limits of normal, not heterogeneous. Other bilateral cervical lymph nodes are smaller. Left level 2 nodes are up to 6 mm. No cystic or necrotic node.   Vascular: Major vascular structures in the neck and at the skull base are patent with no significant atherosclerosis. Partially retropharyngeal course of both carotids.   Limited intracranial: Negative.   Visualized orbits: Negative.   Mastoids and visualized paranasal sinuses: Clear.   Skeleton: Ordinary cervical spine degeneration. No acute or suspicious osseous lesion identified.   Upper chest: Respiratory motion. Negative visible superior mediastinum. Grossly negative lungs.   IMPRESSION: 1. Marked area of concern corresponds to the right parotid space where two separate oval  enhancing masses are up to 1.9 cm individually. These are most compatible with Primary Salivary Neoplasms. The imaging appearance is nonspecific, and there is a nearby maximal right level 2 cervical lymph node, but multiplicity in the parotid generally favors a benign histology such as Warthin's tumor. Recommend ENT follow-up.   2. Otherwise negative CT appearance of the Neck.     Electronically Signed   By:  Marlise Simpers M.D.   On: 09/13/2023 14:15   Labs:  CBC: Recent Labs    09/13/23 1220 09/13/23 1227  WBC 9.3  --   HGB 10.4* 11.9*  HCT 34.8* 35.0*  PLT 470*  --     COAGS: No results for input(s): "INR", "APTT" in the last 8760 hours.  BMP: Recent Labs    09/13/23 1227  NA 139  K 3.8  CL 105  GLUCOSE 83  BUN 7  CREATININE 0.80    LIVER FUNCTION TESTS: No results for input(s): "BILITOT", "AST", "ALT", "ALKPHOS", "PROT", "ALBUMIN" in the last 8760 hours.  TUMOR MARKERS: No results for input(s): "AFPTM", "CEA", "CA199", "CHROMGRNA" in the last 8760 hours.  Assessment and Plan: Patient was seen by PCP, Dr. Barbar Bonus at [facility], subsequent to a dental procedure on 08/22/23 with swollen right cheek and submandibular lymphadenopathy. She was treated with antibiotics, but swelling did not resolve. CT soft tissue neck w/ CM on 4/08 was notable for "Marked area of concern corresponds to the right parotid space where two separate oval enhancing masses are up to 1.9 cm individually. These are most compatible with Primary Salivary Neoplasms." Patient was recommended for FNA biopsy of parotid lesion.  All labs and medications are within acceptable parameters. No pertinent allergies. Patient has been NPO since midnight. She has requested moderate sedation.  Patient presents for scheduled right parotid lesion biopsy in IR today.  Risks and benefits of parotid lesion biopsy was discussed with the patient and/or patient's family including, but not limited to bleeding,  infection, damage to adjacent structures or low yield requiring additional tests.  All of the questions were answered and there is agreement to proceed.  Consent signed and in chart.      Thank you for allowing our service to participate in Yerlin B Cottom 's care.  Electronically Signed: Lovena Rubinstein, PA-C   10/18/2023, 11:25 AM      I spent a total of 30 Minutes  in face to face in clinical consultation, greater than 50% of which was counseling/coordinating care for parotid lesion, with consideration for biopsy.

## 2023-10-20 LAB — CYTOLOGY - NON PAP

## 2024-05-23 ENCOUNTER — Encounter: Payer: Self-pay | Admitting: Emergency Medicine

## 2024-05-23 ENCOUNTER — Ambulatory Visit
Admission: EM | Admit: 2024-05-23 | Discharge: 2024-05-23 | Disposition: A | Discharge status: Home / Self Care | Source: Home / Self Care

## 2024-05-23 DIAGNOSIS — R062 Wheezing: Secondary | ICD-10-CM

## 2024-05-23 DIAGNOSIS — J208 Acute bronchitis due to other specified organisms: Secondary | ICD-10-CM | POA: Diagnosis not present

## 2024-05-23 LAB — POC COVID19/FLU A&B COMBO
Covid Antigen, POC: NEGATIVE
Influenza A Antigen, POC: NEGATIVE
Influenza B Antigen, POC: NEGATIVE

## 2024-05-23 MED ORDER — PROMETHAZINE-DM 6.25-15 MG/5ML PO SYRP: 5.0000 mL | ORAL_SOLUTION | Freq: Four times a day (QID) | ORAL | 0 refills | Status: AC | PRN

## 2024-05-23 MED ORDER — ALBUTEROL SULFATE HFA 108 (90 BASE) MCG/ACT IN AERS: 2.0000 | INHALATION_SPRAY | RESPIRATORY_TRACT | 0 refills | Status: AC | PRN

## 2024-05-23 MED ORDER — DEXAMETHASONE SOD PHOSPHATE PF 10 MG/ML IJ SOLN
10.0000 mg | Freq: Once | INTRAMUSCULAR | Status: AC
  Administered 2024-05-23: 18:00:00 10 mg via INTRAMUSCULAR

## 2024-05-23 MED ORDER — IPRATROPIUM-ALBUTEROL 0.5-2.5 (3) MG/3ML IN SOLN
3.0000 mL | Freq: Once | RESPIRATORY_TRACT | Status: AC
  Administered 2024-05-23: 18:00:00 3 mL via RESPIRATORY_TRACT

## 2024-05-23 NOTE — ED Provider Notes (Signed)
 [facility]    CSN: [id number] Arrival date & time: 05/23/24  1648      History   Chief Complaint No chief complaint on file.   HPI Briana Clark is a 53 y.o. female.   Patient presenting today with 5-day history of fever, sore throat, body aches, chills, cough, congestion.  Denies chest pain, shortness of breath, abdominal pain, vomiting, diarrhea.  So far trying over-the-counter remedies with minimal relief.    Past Medical History:  Diagnosis Date   Hemangioma    right side/UE and LE   Hyperlipidemia     Patient Active Problem List   Diagnosis Date Noted   Anxiety 02/16/2018   Tobacco abuse counseling 02/16/2018   Mixed hyperlipidemia 02/16/2018    Past Surgical History:  Procedure Laterality Date   ABDOMINAL HYSTERECTOMY     TONSILLECTOMY      OB History     Gravida  2   Para  2   Term  2   Preterm      AB      Living  2      SAB      IAB      Ectopic      Multiple      Live Births               Home Medications    Prior to Admission medications  Medication Sig Start Date End Date Taking? Authorizing Provider  albuterol  (VENTOLIN  HFA) 108 (90 Base) MCG/ACT inhaler Inhale 2 puffs into the lungs every 4 (four) hours as needed. 05/23/24  Yes Stuart Vernell Norris, PA-C  promethazine -dextromethorphan (PROMETHAZINE -DM) 6.25-15 MG/5ML syrup Take 5 mLs by mouth 4 (four) times daily as needed. 05/23/24  Yes Stuart Vernell Norris, PA-C  Fezolinetant  (VEOZAH ) 45 MG TABS Take 1 tablet (45 mg total) by mouth at bedtime. Patient not taking: Reported on 08/08/2023 03/15/23   Jayne Vonn DEL, MD  FLUoxetine  (PROZAC ) 40 MG capsule Take 1 capsule (40 mg total) by mouth daily. Patient taking differently: Take 80 mg by mouth daily. 02/16/18   Rolinda Vernell, MD  hydrOXYzine (ATARAX) 10 MG tablet Take 10 mg by mouth 3 (three) times daily as needed.    [provider]  loratadine-pseudoephedrine (CLARITIN-D 24-HOUR) 10-240 MG 24  hr tablet Take 1 tablet by mouth daily.    [provider]  rosuvastatin (CRESTOR) 10 MG tablet Take 10 mg by mouth at bedtime. 08/01/23   [provider]    Family History Family History  Problem Relation Age of Onset   Asthma Mother    COPD Mother    Hashimoto's thyroiditis Mother    Brain cancer Father    Thyroid  cancer Sister    Hypothyroidism Sister    Yvone' disease Sister    Goiter Brother    CAD Paternal Grandfather     Social History Social History[1]   Allergies   Latex and Penicillins   Review of Systems Review of Systems Per HPI  Physical Exam Triage Vital Signs ED Triage Vitals  Encounter Vitals Group     BP 05/23/24 1712 120/74     Girls Systolic BP Percentile --      Girls Diastolic BP Percentile --      Boys Systolic BP Percentile --      Boys Diastolic BP Percentile --      Pulse Rate 05/23/24 1712 (!) 108     Resp 05/23/24 1712 18     Temp 05/23/24  1712 98.9 F (37.2 C)     Temp Source 05/23/24 1712 Oral     SpO2 05/23/24 1712 94 %     Weight --      Height --      Head Circumference --      Peak Flow --      Pain Score 05/23/24 1713 0     Pain Loc --      Pain Education --      Exclude from Growth Chart --    No data found.  Updated Vital Signs BP 120/74 (BP Location: Right Arm)   Pulse 100   Temp 98.9 F (37.2 C) (Oral)   Resp 18   SpO2 93%   Visual Acuity Right Eye Distance:   Left Eye Distance:   Bilateral Distance:    Right Eye Near:   Left Eye Near:    Bilateral Near:     Physical Exam Vitals and nursing note reviewed.  Constitutional:      Appearance: Normal appearance.  HENT:     Head: Atraumatic.     Right Ear: Tympanic membrane and external ear normal.     Left Ear: Tympanic membrane and external ear normal.     Nose: Rhinorrhea present.     Mouth/Throat:     Mouth: Mucous membranes are moist.     Pharynx: Posterior oropharyngeal erythema present.  Eyes:     Extraocular Movements:  Extraocular movements intact.     Conjunctiva/sclera: Conjunctivae normal.  Cardiovascular:     Rate and Rhythm: Normal rate and regular rhythm.     Heart sounds: Normal heart sounds.  Pulmonary:     Effort: Pulmonary effort is normal.     Breath sounds: Wheezing present.  Musculoskeletal:        General: Normal range of motion.     Cervical back: Normal range of motion and neck supple.  Skin:    General: Skin is warm and dry.  Neurological:     Mental Status: She is alert and oriented to person, place, and time.  Psychiatric:        Mood and Affect: Mood normal.        Thought Content: Thought content normal.      UC Treatments / Results  Labs (all labs ordered are listed, but only abnormal results are displayed) Labs Reviewed  POC COVID19/FLU A&B COMBO    EKG   Radiology No results found.  Procedures Procedures (including critical care time)  Medications Ordered in UC Medications  ipratropium-albuterol  (DUONEB) 0.5-2.5 (3) MG/3ML nebulizer solution 3 mL (3 mLs Nebulization Given 05/23/24 1745)  dexamethasone  (DECADRON ) injection 10 mg (10 mg Intramuscular Given 05/23/24 1810)    Initial Impression / Assessment and Plan / UC Course  I have reviewed the triage vital signs and the nursing notes.  Pertinent labs & imaging results that were available during my care of the patient were reviewed by me and considered in my medical decision making (see chart for details).     Vitals and exam overall reassuring apart from significant wheezes on initial exam.  Moderate improvement after DuoNeb treatment and patient reports symptomatic improvement.  Will treat for viral bronchitis likely with some reactive airway disease exacerbation with IM Decadron , albuterol , Phenergan  DM.  Discussed supportive over-the-counter medications and home care.  Return for worsening or unresolving symptoms.  Final Clinical Impressions(s) / UC Diagnoses   Final diagnoses:  Viral bronchitis   Wheezing   Discharge Instructions   None  ED Prescriptions     Medication Sig Dispense Auth. Provider   albuterol  (VENTOLIN  HFA) 108 (90 Base) MCG/ACT inhaler Inhale 2 puffs into the lungs every 4 (four) hours as needed. 18 g Stuart Vernell Norris, PA-C   promethazine -dextromethorphan (PROMETHAZINE -DM) 6.25-15 MG/5ML syrup Take 5 mLs by mouth 4 (four) times daily as needed. 100 mL Stuart Vernell Norris, [place]      PDMP not reviewed this encounter.    [1]  Social History Tobacco Use   Smoking status: Every Day    Current packs/day: 0.50    Average packs/day: 0.5 packs/day for 26.0 years (13.0 ttl pk-yrs)    Types: Cigarettes   Smokeless tobacco: Never   Tobacco comments:    Chantix did not work-SI; welbutrin caused constipation  Vaping Use   Vaping status: Never Used  Substance Use Topics   Alcohol use: Yes    Comment: occ   Drug use: No     Stuart Vernell Norris, PA-C 05/23/24 1814

## 2024-05-23 NOTE — ED Triage Notes (Signed)
 Fever on Sunday with sore throat, body aches and chills, cough and nasal congestion.

## 2024-05-25 ENCOUNTER — Telehealth: Payer: Self-pay | Admitting: Emergency Medicine

## 2024-05-25 NOTE — Telephone Encounter (Signed)
 Pt presented to UC inquiring if work note could include today, pt seen on 12/17. Pt reports original note was to return today. Discussed with UC provider, verbal order to provide pt with note to return on 12/20. Updated work note printed and given to pt.

## 2024-06-26 ENCOUNTER — Other Ambulatory Visit (HOSPITAL_COMMUNITY): Payer: Self-pay | Admitting: Family Medicine

## 2024-06-26 DIAGNOSIS — Z1231 Encounter for screening mammogram for malignant neoplasm of breast: Secondary | ICD-10-CM

## 2024-07-23 ENCOUNTER — Ambulatory Visit (HOSPITAL_COMMUNITY)

## 2024-07-30 ENCOUNTER — Inpatient Hospital Stay

## 2024-07-30 ENCOUNTER — Inpatient Hospital Stay: Admitting: Oncology
